# Patient Record
Sex: Female | Born: 1970
Health system: Southern US, Community
[De-identification: ages and names within clinical notes are randomized; demographics above are authoritative.]

## PROBLEM LIST (undated history)

## (undated) DIAGNOSIS — F32A Depression, unspecified: Secondary | ICD-10-CM

## (undated) DIAGNOSIS — K5900 Constipation, unspecified: Secondary | ICD-10-CM

## (undated) DIAGNOSIS — R600 Localized edema: Secondary | ICD-10-CM

## (undated) DIAGNOSIS — G4730 Sleep apnea, unspecified: Secondary | ICD-10-CM

## (undated) DIAGNOSIS — D179 Benign lipomatous neoplasm, unspecified: Secondary | ICD-10-CM

## (undated) DIAGNOSIS — K589 Irritable bowel syndrome without diarrhea: Secondary | ICD-10-CM

## (undated) DIAGNOSIS — D649 Anemia, unspecified: Secondary | ICD-10-CM

## (undated) DIAGNOSIS — N189 Chronic kidney disease, unspecified: Secondary | ICD-10-CM

## (undated) DIAGNOSIS — M2550 Pain in unspecified joint: Secondary | ICD-10-CM

## (undated) DIAGNOSIS — N289 Disorder of kidney and ureter, unspecified: Secondary | ICD-10-CM

## (undated) DIAGNOSIS — M1990 Unspecified osteoarthritis, unspecified site: Secondary | ICD-10-CM

## (undated) DIAGNOSIS — K219 Gastro-esophageal reflux disease without esophagitis: Secondary | ICD-10-CM

## (undated) DIAGNOSIS — M549 Dorsalgia, unspecified: Secondary | ICD-10-CM

## (undated) HISTORY — DX: Gastro-esophageal reflux disease without esophagitis: K21.9

## (undated) HISTORY — DX: Irritable bowel syndrome, unspecified: K58.9

## (undated) HISTORY — DX: Dorsalgia, unspecified: M54.9

## (undated) HISTORY — DX: Disorder of kidney and ureter, unspecified: N28.9

## (undated) HISTORY — DX: Chronic kidney disease, unspecified: N18.9

## (undated) HISTORY — PX: ENDOMETRIAL ABLATION: SHX621

## (undated) HISTORY — PX: ABLATION: SHX5711

## (undated) HISTORY — DX: Anemia, unspecified: D64.9

## (undated) HISTORY — DX: Pain in unspecified joint: M25.50

## (undated) HISTORY — DX: Benign lipomatous neoplasm, unspecified: D17.9

## (undated) HISTORY — DX: Constipation, unspecified: K59.00

## (undated) HISTORY — DX: Sleep apnea, unspecified: G47.30

## (undated) HISTORY — DX: Localized edema: R60.0

## (undated) HISTORY — DX: Depression, unspecified: F32.A

## (undated) HISTORY — DX: Unspecified osteoarthritis, unspecified site: M19.90

---

## 1999-02-04 ENCOUNTER — Other Ambulatory Visit: Admission: RE | Admit: 1999-02-04 | Discharge: 1999-02-04 | Payer: Self-pay | Admitting: Obstetrics and Gynecology

## 2000-02-19 ENCOUNTER — Other Ambulatory Visit: Admission: RE | Admit: 2000-02-19 | Discharge: 2000-02-19 | Payer: Self-pay | Admitting: Obstetrics and Gynecology

## 2001-03-23 ENCOUNTER — Other Ambulatory Visit: Admission: RE | Admit: 2001-03-23 | Discharge: 2001-03-23 | Payer: Self-pay | Admitting: Obstetrics and Gynecology

## 2002-05-01 ENCOUNTER — Other Ambulatory Visit: Admission: RE | Admit: 2002-05-01 | Discharge: 2002-05-01 | Payer: Self-pay | Admitting: Obstetrics and Gynecology

## 2003-07-09 ENCOUNTER — Other Ambulatory Visit: Admission: RE | Admit: 2003-07-09 | Discharge: 2003-07-09 | Payer: Self-pay | Admitting: Obstetrics and Gynecology

## 2004-08-21 ENCOUNTER — Other Ambulatory Visit: Admission: RE | Admit: 2004-08-21 | Discharge: 2004-08-21 | Payer: Self-pay | Admitting: Obstetrics and Gynecology

## 2004-09-07 ENCOUNTER — Encounter: Admission: RE | Admit: 2004-09-07 | Discharge: 2004-09-07 | Payer: Self-pay | Admitting: Surgery

## 2004-10-29 ENCOUNTER — Encounter (INDEPENDENT_AMBULATORY_CARE_PROVIDER_SITE_OTHER): Payer: Self-pay | Admitting: Specialist

## 2004-10-29 ENCOUNTER — Ambulatory Visit (HOSPITAL_BASED_OUTPATIENT_CLINIC_OR_DEPARTMENT_OTHER): Admission: RE | Admit: 2004-10-29 | Discharge: 2004-10-29 | Payer: Self-pay | Admitting: Surgery

## 2004-10-29 ENCOUNTER — Ambulatory Visit (HOSPITAL_COMMUNITY): Admission: RE | Admit: 2004-10-29 | Discharge: 2004-10-29 | Payer: Self-pay | Admitting: Surgery

## 2005-10-04 ENCOUNTER — Other Ambulatory Visit: Admission: RE | Admit: 2005-10-04 | Discharge: 2005-10-04 | Payer: Self-pay | Admitting: Obstetrics and Gynecology

## 2007-01-12 ENCOUNTER — Ambulatory Visit (HOSPITAL_COMMUNITY): Admission: RE | Admit: 2007-01-12 | Discharge: 2007-01-12 | Payer: Self-pay | Admitting: Obstetrics and Gynecology

## 2010-12-25 NOTE — Op Note (Signed)
Allison Terrell, BEUMER                 ACCOUNT NO.:  [id number]   MEDICAL RECORD NO.:  [id number]          PATIENT TYPE:  AMB   LOCATION:  DSC                          FACILITY:  [facility]   PHYSICIAN:  Currie Paris, M.D.DATE OF BIRTH:  11-Jul-1971   DATE OF PROCEDURE:  10/29/2004  DATE OF DISCHARGE:                                 OPERATIVE REPORT   PHYSICIAN'S OFFICE NUMBER:  [id number]   PREOPERATIVE DIAGNOSIS:  Abdominal wall mass, probable endometrioma.   POSTOPERATIVE DIAGNOSIS:  Abdominal wall mass, probable endometrioma.   OPERATION PERFORMED:  Removal of abdominal wall mass.   SURGEON:  Currie Paris, M.D.   ANESTHESIA:  General, LMA.   CLINICAL HISTORY:  This is a 40 year old lady who has had a questionable  abdominal wall hernia with an area in the old scar from her Pfannenstiel.  She thinks it gets more prominent around the time of her menstrual cycle.  On exam it felt like a small mass in the area and CT confirmed a small mass,  apparently attached or just anterior to the fascia.  After discussion with  the patient she elected to proceed to abdominal wall exploration with  excision of the mass.   DESCRIPTION OF OPERATION:  The patient was seen in the holding area and she  had no further questions. The mass itself was identified by the patient and  the area was marked by me and the patient.  She was brought to the operating  room and after satisfactory general anesthesia had been obtained the area  was prepped and draped.  The time out occurred.  I infiltrated a combination  of 1% Xylocaine and 0.5% plain Marcaine to help with postop analgesia.   I reopened the old Pfannenstiel scar, divided subcutaneous tissue and  identified where the mass was.  It appeared to be within the fascia.  Using  cautery the mass was excised and I took a small rim of tissue around it,  which therefore included a small amount of the anterior sheath over the  rectus.  The mass was  excised completely.  Although I appeared to have  intact muscle and some fascia underneath I went ahead and closed this with  some 0-Prolene to make sure we had good approximation of the anterior  sheath, and then put an onlay of Marlex mesh on top suturing it in with some  Prolene sutures in the usual fashion.  Everything was dry so the incision  was closed with 3-0 Vicryl followed by 4-0 Monocryl subcuticular and  Dermabond.   The patient tolerated the procedure well.   COMPLICATIONS:  There were no operative complications.   COUNTS:  All counts were correct.      CJS/MEDQ  D:  10/29/2004  T:  10/30/2004  Job:  [id number]   cc:   Windle Guard, M.D.  [address]  [place], [place] [address]  Fax: [phone]   D[facility] Salvia. Marcelle Overlie, M.D.  [address] [place]  [place] [address]  Fax: [phone]

## 2014-04-08 ENCOUNTER — Other Ambulatory Visit: Payer: Self-pay | Admitting: Obstetrics and Gynecology

## 2014-04-09 LAB — CYTOLOGY - PAP

## 2014-11-27 ENCOUNTER — Encounter: Payer: Self-pay | Admitting: Podiatry

## 2014-11-27 ENCOUNTER — Ambulatory Visit (INDEPENDENT_AMBULATORY_CARE_PROVIDER_SITE_OTHER): Payer: BLUE CROSS/BLUE SHIELD | Admitting: Podiatry

## 2014-11-27 ENCOUNTER — Ambulatory Visit (INDEPENDENT_AMBULATORY_CARE_PROVIDER_SITE_OTHER): Payer: BLUE CROSS/BLUE SHIELD

## 2014-11-27 VITALS — BP 115/74 | HR 86 | Resp 12

## 2014-11-27 DIAGNOSIS — M722 Plantar fascial fibromatosis: Secondary | ICD-10-CM

## 2014-11-27 DIAGNOSIS — R52 Pain, unspecified: Secondary | ICD-10-CM

## 2014-11-27 DIAGNOSIS — M205X1 Other deformities of toe(s) (acquired), right foot: Secondary | ICD-10-CM

## 2014-11-27 NOTE — Progress Notes (Signed)
Subjective:    Patient ID: Allison Terrell, female    DOB: 1971-03-25, 45 y.o.   MRN: [id number]  HPI  N-SORE L-LT BOTTOM OF THE HEEL D-5+ YEARS O-SLOWLY C-WORSE, ESPECIALLY FIRST THING IN THE MORNING. A-PRESSURE T-NONE Patient recalls 1 injection of the left heel some years ago, however, the heel pain has episodes of remission and exacerbation of the symptoms. She says that this is the secondary problem that she wants to have evaluated today  N-SORE, REDNESS L-RT FOOT D-5 MONTHS O-SLOWLY C-BETTER A-PRESSURE T-PRESSURE  Patient is concerned about painful right great toe joint that is uncomfortable standing and walking describing an aching type sensation. She describes using an Ace bandage to wrap the area. She would like to have this, evaluated.  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      Objective:   Physical Exam  Orientated 3  Vascular: DP pulses 2/4 bilaterally PT pulses 2/4 bilaterally Capillary reflex immediate bilaterally  Neurological: Sensation to 10 g monofilament wire intact 5/5 bilaterally Vibratory sensation intact bilaterally Ankle reflex equal and reactive bilaterally  Dermatological: Texture and turgor within normal limits  Musculoskeletal: Functional hallux limitus bilaterally Palpable tenderness in the right first MPJ with limited range of motion and tenderness upon range of motion. HAV deformity bilaterally There is palpable tenderness the medial plantar fascial insertional area without any palpable lesions  X-ray examination weightbearing right foot  Intact bony structure without fracture and/or dislocation Well-organized inferior calcaneal spur Posterior calcaneal  spur HAV deformity Narrowing of the right first metatarsophalangeal joint with squaring of the margins  Radiographic impression: No acute bony abnormality noted HAV deformity with associated mild osteoarthritis   X-ray examination weightbearing left foot  Intact bony structure without fracture and/or dislocation Posterior and inferior calcaneal spurs HAV deformity Mild narrowing of the first metatarsal phalangeal joint with squaring of the margins  Radiographic impression: No acute bony abnormality noted left foot HAV deformity with mild osteoarthritis first metatarsophalangeal joint          Assessment & Plan:   Assessment: Satisfactory neurovascular status Mild osteoarthritis first MPJ bilaterally right more symptomatic than left Hallux limitus bilaterally HAV deformities bilaterally Plantar fasciitis left  Plan: I had a detailed discussion with patient today reviewing the results of the examination x-rays. I made her aware that the right great toe joint pain was associated with mild osteoarthritis. I informed her that the problem generally tend to be more progressive depending on how much standing and/or walking . I also made aware that she has plantar fasciitis left and that the osteoarthritis in the plantar fasciitis were not directly related.  I discussed with patient treatment options for the osteoarthritis/hallux limitus right treatment options including relative reduction of physical activity standing, and/or walking. Also recommended using a exercise bike instead of jogging or running is much as possible. Also, I recommended a orthotic with a turf toe extension to reduce the flexion extension of right first MPJ. In addition I would have the orthotic fabricated to treatment of plantar fasciitis on the left foot. I offered patient custom foot orthotic and she verbally consented. A digital scan was obtained today for the fabrication of a custom foot orthotic  for the  indication of hallux limitus/rigidus right and plantar fasciitis left. A digital scan was obtained  Patient was discharged. At discharge the dischargedsecretary Threasa Beards) explained to patient about the  insurance benefits for orthotics. Threasa Beards said that she would  contact her insurance company directly to find out about these benefits. Apparently patient became quite agitated and in spite of repetitive attempts by [facility] as well as my assistant Vicente Males,  to explained to the patient that the orthotics were prescribed primarily for the hallux limitus, rigidus right also there would be benefits for plantar fasciitis left. In spite of attempts to explained to patient about the orthotics patient apparently became angry and left the office.

## 2014-11-27 NOTE — Patient Instructions (Signed)
Plantar Fasciitis Plantar fasciitis is a common condition that causes foot pain. It is soreness (inflammation) of the band of tough fibrous tissue on the bottom of the foot that runs from the heel bone (calcaneus) to the ball of the foot. The cause of this soreness may be from excessive standing, poor fitting shoes, running on hard surfaces, being overweight, having an abnormal walk, or overuse (this is common in runners) of the painful foot or feet. It is also common in aerobic exercise dancers and ballet dancers. SYMPTOMS  Most people with plantar fasciitis complain of:  Severe pain in the morning on the bottom of their foot especially when taking the first steps out of bed. This pain recedes after a few minutes of walking.  Severe pain is experienced also during walking following a long period of inactivity.  Pain is worse when walking barefoot or up stairs DIAGNOSIS   Your caregiver will diagnose this condition by examining and feeling your foot.  Special tests such as X-rays of your foot, are usually not needed. PREVENTION   Consult a sports medicine professional before beginning a new exercise program.  Walking programs offer a good workout. With walking there is a lower chance of overuse injuries common to runners. There is less impact and less jarring of the joints.  Begin all new exercise programs slowly. If problems or pain develop, decrease the amount of time or distance until you are at a comfortable level.  Wear good shoes and replace them regularly.  Stretch your foot and the heel cords at the back of the ankle (Achilles tendon) both before and after exercise.  Run or exercise on even surfaces that are not hard. For example, asphalt is better than pavement.  Do not run barefoot on hard surfaces.  If using a treadmill, vary the incline.  Do not continue to workout if you have foot or joint problems. Seek professional help if they do not improve. HOME CARE  INSTRUCTIONS   Avoid activities that cause you pain until you recover.  Use ice or cold packs on the problem or painful areas after working out.  Only take over-the-counter or prescription medicines for pain, discomfort, or fever as directed by your caregiver.  Soft shoe inserts or athletic shoes with air or gel sole cushions may be helpful.  If problems continue or become more severe, consult a sports medicine caregiver or your own health care provider. Cortisone is a potent anti-inflammatory medication that may be injected into the painful area. You can discuss this treatment with your caregiver. MAKE SURE YOU:   Understand these instructions.  Will watch your condition.  Will get help right away if you are not doing well or get worse. Document Released: 04/20/2001 Document Revised: 10/18/2011 Document Reviewed: 06/19/2008 [facility] Patient Information 2015 [place], [place]. This information is not intended to replace advice given to you by your health care provider. Make sure you discuss any questions you have with your health care provider.   Hallux Rigidus Hallux rigidus is a condition involving pain and a loss of motion of the first (big) toe. The pain gets worse with lifting up (extension) of the toe. This is usually due to arthritic bony bumps (spurring) of the joint at the base of the big toe.  SYMPTOMS   Pain, with lifting up of the toe.  Tenderness over the joint where the big toe meets the foot.  Redness, swelling, and warmth over the top of the base of the big toe (  sometimes).  Foot pain, stiffness, and limping. CAUSES  Hallux rigidus is caused by arthritis of the joint where the big toe meets the foot. The arthritis creates a bone spur that pinches the soft tissues when the toe is extended. RISK INCREASES WITH:  Tight shoes with a narrow toe box.  Family history of foot problems.  Gout and rheumatoid and psoriatic arthritis.  History of previous toe injury,  including "turf toe."  Long first toe, flat feet, and other big toe bony bumps.  Arthritis of the big toe. PREVENTION   Wear wide-toed shoes that fit well.  Tape the big toe to reduce motion and to prevent pinching of the tissues between the bone.  Maintain physical fitness:  Foot and ankle flexibility.  Muscle strength and endurance. PROGNOSIS  This condition can usually be managed with proper treatment. However, surgery is typically required to prevent the problem from recurring.  RELATED COMPLICATIONS  Injury to other areas of the foot or ankle, caused by abnormal walking in an attempt to avoid the pain felt when walking normally. TREATMENT Treatment first involves stopping the activities that aggravate your symptoms. Ice and medicine can be used to reduce the pain and inflammation. Modifications to shoes may help reduce pain, including wearing stiff-soled shoes, shoes with a wide toe box, inserting a padded donut to relieve pressure on top of the joint, or wearing an arch support. Corticosteroid injections may be given to reduce inflammation. If nonsurgical treatment is unsuccessful, surgery may be needed. Surgical options include removing the arthritic bony spur, cutting a bone in the foot to change the arc of motion (allowing the toe to extend more), or fusion of the joint (eliminating all motion in the joint at the base of the big toe).  MEDICATION   If pain medicine is needed, nonsteroidal anti-inflammatory medicines (aspirin and ibuprofen), or other minor pain relievers (acetaminophen), are often advised.  Do not take pain medicine for 7 days before surgery.  Prescription pain relievers are usually prescribed only after surgery. Use only as directed and only as much as you need.  Ointments for arthritis, applied to the skin, may give some relief.  Injections of corticosteroids may be given to reduce inflammation. HEAT AND COLD  Cold treatment (icing) relieves pain and  reduces inflammation. Cold treatment should be applied for 10 to 15 minutes every 2 to 3 hours, and immediately after activity that aggravates your symptoms. Use ice packs or an ice massage.  Heat treatment may be used before performing the stretching and strengthening activities prescribed by your caregiver, physical therapist, or athletic trainer. Use a heat pack or a warm water soak. SEEK MEDICAL CARE IF:   Symptoms get worse or do not improve in 2 weeks, despite treatment.  After surgery you develop fever, increasing pain, redness, swelling, drainage of fluids, bleeding, or increasing warmth.  New, unexplained symptoms develop. (Drugs used in treatment may produce side effects.) Document Released: 07/26/2005 Document Revised: 12/10/2013 Document Reviewed: 11/07/2008 [facility] Patient Information 2015 [place], [place]. This information is not intended to replace advice given to you by your health care provider. Make sure you discuss any questions you have with your health care provider.

## 2015-01-22 ENCOUNTER — Ambulatory Visit (INDEPENDENT_AMBULATORY_CARE_PROVIDER_SITE_OTHER): Payer: BLUE CROSS/BLUE SHIELD | Admitting: Podiatry

## 2015-01-22 DIAGNOSIS — M205X1 Other deformities of toe(s) (acquired), right foot: Secondary | ICD-10-CM

## 2015-01-22 NOTE — Patient Instructions (Signed)

## 2015-01-22 NOTE — Progress Notes (Signed)
   Subjective:    Patient ID: Allison Terrell, female    DOB: Dec 30, 1970, 44 y.o.   MRN: [id number]  HPI  PUO AND GIVEN INSTRUCTION.  Review of Systems     Objective:   Physical Exam  Custom orthotics with turf toe extension right contour satisfactorily      Assessment & Plan:   Assessment: Satisfactory fit of custom orthotics Hallux limitus right  Plan: Wearing instruction provided  Reappoint 6 weeks

## 2015-03-05 ENCOUNTER — Ambulatory Visit (INDEPENDENT_AMBULATORY_CARE_PROVIDER_SITE_OTHER): Payer: BLUE CROSS/BLUE SHIELD | Admitting: Podiatry

## 2015-03-05 ENCOUNTER — Encounter: Payer: Self-pay | Admitting: Podiatry

## 2015-03-05 VITALS — BP 118/78 | HR 62 | Resp 14

## 2015-03-05 DIAGNOSIS — M205X1 Other deformities of toe(s) (acquired), right foot: Secondary | ICD-10-CM

## 2015-03-05 NOTE — Patient Instructions (Signed)
Try wearing a larger shoe with the orthotic inside the right shoe Return 2 weeks

## 2015-03-06 NOTE — Progress Notes (Signed)
Patient ID: Allison Terrell, female   DOB: 04-Feb-1971, 44 y.o.   MRN: [id number]  Subjective: This patient presents for follow-up care for tolerance of foot orthotics with a turf toe extension on the right foot for treatment of hallux limitus/rigidus and plantar fasciitis left At this time patient states that she wears the orthotic somewhat intermittently has not changed her shoe size. Her complaint is that the turf toe extension ends at the plantar base of the distal phalanx, however, the distal adage embeds into the plantar pulp of the left hallux and she finds this uncomfortable.  Objective: Orthotics with curved toe extension contour satisfactorily  Assessment: Some intolerance of the turf toe extension right that may need to be extended to accommodate the patient's wish   Plan: I advised patient to purchase a larger pair shoes to see if this made the orthotic more comfortable. If this does not improve over time but will have the lab remake the orthotic with a turf toe extension standing approximately another 15 mm  Reappoint 4 weeks

## 2015-04-01 ENCOUNTER — Ambulatory Visit: Payer: BLUE CROSS/BLUE SHIELD | Admitting: Podiatry

## 2015-04-15 ENCOUNTER — Other Ambulatory Visit: Payer: Self-pay | Admitting: Obstetrics and Gynecology

## 2015-04-16 LAB — CYTOLOGY - PAP

## 2015-04-30 ENCOUNTER — Ambulatory Visit: Payer: BLUE CROSS/BLUE SHIELD | Admitting: Podiatry

## 2015-05-13 ENCOUNTER — Encounter: Payer: Self-pay | Admitting: Podiatry

## 2015-05-13 ENCOUNTER — Ambulatory Visit (INDEPENDENT_AMBULATORY_CARE_PROVIDER_SITE_OTHER): Payer: BLUE CROSS/BLUE SHIELD | Admitting: Podiatry

## 2015-05-13 VITALS — BP 151/81 | HR 82 | Resp 12

## 2015-05-13 DIAGNOSIS — M205X1 Other deformities of toe(s) (acquired), right foot: Secondary | ICD-10-CM

## 2015-05-13 DIAGNOSIS — M722 Plantar fascial fibromatosis: Secondary | ICD-10-CM

## 2015-05-13 NOTE — Progress Notes (Signed)
Patient ID: Allison Terrell, female   DOB: [birth year], 44 y.o.   MRN: [id number]  Subjective: Patient presents for follow-up care for tolerance of orthotics with extension to treat hallux limitus right and plantar fasciitis left. At this time she states that her right great toe joint has improved and she is having less pain. She also describes less pain in her left heel  Objective: First MPJ is not tender on range of motion or palpation Mild palpable tenderness medial plantar left heel without any palpable lesions  Assessment: Reducing symptoms of hallux limitus rigidus right Improving plantar fasciitis left Satisfactory fit of custom orthotics  Plan: Offered patient Kenalog injection left for fasciitis patient declines Patient will continue wearing custom foot orthotics on a daily basis  Reappoint at patient's request

## 2015-05-13 NOTE — Patient Instructions (Signed)
Continue wearing her custom foot orthotics an ongoing continuous basis when you stand or walk Return as needed

## 2016-03-11 ENCOUNTER — Encounter (HOSPITAL_COMMUNITY): Payer: Self-pay | Admitting: Emergency Medicine

## 2016-03-11 ENCOUNTER — Ambulatory Visit (HOSPITAL_COMMUNITY)
Admission: EM | Admit: 2016-03-11 | Discharge: 2016-03-11 | Disposition: A | Discharge status: Home / Self Care | Payer: BLUE CROSS/BLUE SHIELD | Attending: Emergency Medicine | Admitting: Emergency Medicine

## 2016-03-11 DIAGNOSIS — R1030 Lower abdominal pain, unspecified: Secondary | ICD-10-CM | POA: Diagnosis not present

## 2016-03-11 DIAGNOSIS — R3129 Other microscopic hematuria: Secondary | ICD-10-CM | POA: Diagnosis not present

## 2016-03-11 DIAGNOSIS — Z87442 Personal history of urinary calculi: Secondary | ICD-10-CM | POA: Diagnosis not present

## 2016-03-11 DIAGNOSIS — N189 Chronic kidney disease, unspecified: Secondary | ICD-10-CM | POA: Diagnosis not present

## 2016-03-11 DIAGNOSIS — R109 Unspecified abdominal pain: Secondary | ICD-10-CM | POA: Diagnosis present

## 2016-03-11 LAB — POCT URINALYSIS DIP (DEVICE)
BILIRUBIN URINE: NEGATIVE
Glucose, UA: NEGATIVE mg/dL
KETONES UR: NEGATIVE mg/dL
Leukocytes, UA: NEGATIVE
Nitrite: NEGATIVE
PH: 6 (ref 5.0–8.0)
Protein, ur: NEGATIVE mg/dL
SPECIFIC GRAVITY, URINE: 1.025 (ref 1.005–1.030)
Urobilinogen, UA: 0.2 mg/dL (ref 0.0–1.0)

## 2016-03-11 MED ORDER — TAMSULOSIN HCL 0.4 MG PO CAPS
0.4000 mg | ORAL_CAPSULE | Freq: Every day | ORAL | 0 refills | Status: DC
Start: 2016-03-11 — End: 2019-06-14

## 2016-03-11 MED ORDER — HYDROCODONE-ACETAMINOPHEN 5-325 MG PO TABS: 1.0000 | ORAL_TABLET | Freq: Four times a day (QID) | ORAL | 0 refills | Status: DC | PRN

## 2016-03-11 MED ORDER — IBUPROFEN 600 MG PO TABS: 600.0000 mg | ORAL_TABLET | Freq: Four times a day (QID) | ORAL | 0 refills | Status: DC | PRN

## 2016-03-11 NOTE — ED Provider Notes (Signed)
[place]    CSN: [id number] Arrival date & time: 03/11/16  1715  First Provider Contact:  First MD Initiated Contact with Patient 03/11/16 1836        History   Chief Complaint Chief Complaint  Patient presents with  . Abdominal Pain    HPI Allison Terrell is a 45 y.o. female.   She is a 45 year old woman here for evaluation of abdominal pain. She states this started last night. It is described as a cramping type pain located in the left side, left lower quadrant, and left lower back. It comes and goes. When the pain gets severe she will feel nauseous and clammy. It did improve with ibuprofen. Today, she continues to have discomfort, but it is more located across her lower abdomen. She denies any persistent nausea or vomiting. No diarrhea or constipation. She did have a normal for her bowel movement yesterday. Denies dysuria or urinary frequency. No change in the color of her urine. No fevers. She is postmenopausal. She does report a history of kidney stone and states this feels similar, but not quite the same.      Past Medical History:  Diagnosis Date  . Chronic kidney disease     There are no active problems to display for this patient.   Past Surgical History:  Procedure Laterality Date  . CESAREAN SECTION      OB History    No data available       Home Medications    Prior to Admission medications   Medication Sig Start Date End Date Taking? Authorizing Provider  HYDROcodone-acetaminophen (NORCO) 5-325 MG tablet Take 1 tablet by mouth every 6 (six) hours as needed for moderate pain. 03/11/16   Melony Overly, MD  ibuprofen (ADVIL,MOTRIN) 600 MG tablet Take 1 tablet (600 mg total) by mouth every 6 (six) hours as needed for moderate pain. 03/11/16   Melony Overly, MD  Norethindrone Acetate-Ethinyl Estrad-FE (LOESTRIN 24 FE) 1-20 MG-MCG(24) tablet  10/15/14   Historical Provider, MD  tamsulosin (FLOMAX) 0.4 MG CAPS capsule Take 1 capsule (0.4 mg total) by  mouth daily. 03/11/16   Melony Overly, MD    Family History No family history on file.  Social History Social History  Substance Use Topics  . Smoking status: Never Smoker  . Smokeless tobacco: Never Used  . Alcohol use Yes     Allergies   Review of patient's allergies indicates no known allergies.   Review of Systems Review of Systems  Constitutional: Negative for fever.  HENT: Negative.   Respiratory: Negative.   Cardiovascular: Negative.   Gastrointestinal: Positive for abdominal pain and nausea. Negative for constipation, diarrhea and vomiting.  Genitourinary: Negative for dysuria and vaginal bleeding.  Musculoskeletal: Positive for back pain.     Physical Exam Triage Vital Signs ED Triage Vitals  Enc Vitals Group     BP      Pulse      Resp      Temp      Temp src      SpO2      Weight      Height      Head Circumference      Peak Flow      Pain Score      Pain Loc      Pain Edu?      Excl. in [place]?    No data found.   Updated Vital Signs BP 138/76   Pulse  71   Temp 98.5 F (36.9 C) (Oral)   Resp 16   SpO2 99%   Visual Acuity Right Eye Distance:   Left Eye Distance:   Bilateral Distance:    Right Eye Near:   Left Eye Near:    Bilateral Near:     Physical Exam  Constitutional: She is oriented to person, place, and time. She appears well-developed and well-nourished. No distress.  Cardiovascular: Normal rate.   Pulmonary/Chest: Effort normal.  Abdominal: Soft. Bowel sounds are normal. She exhibits no distension and no mass. There is tenderness (suprapubic and right lower quadrant). There is no rebound and no guarding.  No CVA tenderness  Neurological: She is alert and oriented to person, place, and time.     UC Treatments / Results  Labs (all labs ordered are listed, but only abnormal results are displayed) Labs Reviewed  POCT URINALYSIS DIP (DEVICE) - Abnormal; Notable for the following:       Result Value   Hgb urine dipstick TRACE  (*)    All other components within normal limits  URINE CULTURE    EKG  EKG Interpretation None       Radiology No results found.  Procedures Procedures (including critical care time)  Medications Ordered in UC Medications - No data to display   Initial Impression / Assessment and Plan / UC Course  I have reviewed the triage vital signs and the nursing notes.  Pertinent labs & imaging results that were available during my care of the patient were reviewed by me and considered in my medical decision making (see chart for details).  Clinical Course    With some blood in her urine, will treat as a kidney stone with Flomax, ibuprofen, and hydrocodone. Urine sent for culture. Follow-up in the ER if not improving over the next 2-3 days.  Final Clinical Impressions(s) / UC Diagnoses   Final diagnoses:  Lower abdominal pain  Microhematuria    New Prescriptions New Prescriptions   HYDROCODONE-ACETAMINOPHEN (NORCO) 5-325 MG TABLET    Take 1 tablet by mouth every 6 (six) hours as needed for moderate pain.   IBUPROFEN (ADVIL,MOTRIN) 600 MG TABLET    Take 1 tablet (600 mg total) by mouth every 6 (six) hours as needed for moderate pain.   TAMSULOSIN (FLOMAX) 0.4 MG CAPS CAPSULE    Take 1 capsule (0.4 mg total) by mouth daily.     Melony Overly, MD 03/11/16 [phone]

## 2016-03-11 NOTE — Discharge Instructions (Signed)
This may be a kidney stone or cramps. Take ibuprofen 6 mg every 6 hours as needed for pain. Take Flomax daily until your pain resolves. This is to help a kidney stone pass. Use the hydrocodone every 4-6 hours as needed for severe pain. Do not drive while taking this medicine. If symptoms are not improving over the next 2-3 days, please go to the emergency room for additional evaluation.

## 2016-03-11 NOTE — ED Triage Notes (Signed)
PT reports abdominal pain over LLQ and lower back. PT reports a cramping sensation.  PT reports nausea. She denies vomiting and diarrhea.

## 2016-03-13 LAB — URINE CULTURE

## 2016-04-21 ENCOUNTER — Other Ambulatory Visit: Payer: Self-pay | Admitting: Obstetrics and Gynecology

## 2016-04-21 DIAGNOSIS — R109 Unspecified abdominal pain: Secondary | ICD-10-CM

## 2016-04-23 ENCOUNTER — Ambulatory Visit
Admission: RE | Admit: 2016-04-23 | Discharge: 2016-04-23 | Disposition: A | Discharge status: Home / Self Care | Payer: BLUE CROSS/BLUE SHIELD | Source: Ambulatory Visit | Attending: Obstetrics and Gynecology | Admitting: Obstetrics and Gynecology

## 2016-04-23 ENCOUNTER — Encounter: Payer: Self-pay | Admitting: Radiology

## 2016-04-23 DIAGNOSIS — R109 Unspecified abdominal pain: Secondary | ICD-10-CM

## 2016-04-23 MED ORDER — IOPAMIDOL (ISOVUE-300) INJECTION 61%
125.0000 mL | Freq: Once | INTRAVENOUS | Status: AC | PRN
  Administered 2016-04-23: 125 mL via INTRAVENOUS

## 2017-05-23 ENCOUNTER — Ambulatory Visit (INDEPENDENT_AMBULATORY_CARE_PROVIDER_SITE_OTHER): Payer: BLUE CROSS/BLUE SHIELD | Admitting: Sports Medicine

## 2017-05-23 VITALS — BP 126/81 | Ht 62.0 in | Wt 230.0 lb

## 2017-05-23 DIAGNOSIS — G8929 Other chronic pain: Secondary | ICD-10-CM | POA: Diagnosis not present

## 2017-05-23 DIAGNOSIS — M545 Low back pain: Secondary | ICD-10-CM | POA: Diagnosis not present

## 2017-05-23 NOTE — Progress Notes (Signed)
   Subjective:    Patient ID: Allison Terrell, female    DOB: January 18, 1971, 46 y.o.   MRN: [id number]  HPI chief complaint: Low back pain  Very pleasant 46 year old female comes in today at the request of her primary care physician for evaluation of low back pain. She describes an aching discomfort that is diffuse across her low-back. Symptoms began gradually about a year ago. Her pain is most noticeable first thing in the morning as well as with bending forward. She denies radiating pain into her legs. She denies numbness or tingling. She denies groin pain. She does take 400-600 mg of ibuprofen as needed and this does seem to help. She had x-rays done at her primary care physician's office and they are available for my review. No prior low back surgeries. No change in bowel or bladder.  Past medical history is reviewed Medications reviewed Allergies reviewed    Review of Systems As above    Objective:   Physical Exam  Well-developed, well-nourished. No acute distress. Awake alert and oriented 3. Vital signs reviewed. Patient sits currently in the exam room.  Lumbar spine: Diffuse tenderness to palpation along the lumbosacral area. No tenderness to palpation along the lumbar midline. There is a palpable trigger point just superior to the SI joint. No tenderness to palpation directly at the SI joint. Full lumbar range of motion. Pain with forward flexion. Painless extension.  Neurological exam: Strength is 5/5 in both lower extremities. Reflexes are brisk and equal at the Achilles and patellar tendons. No atrophy. Negative straight leg raise bilaterally. Sensation is intact to light touch grossly.  X-rays of her lumbar spine including AP and lumbar views are reviewed. Films are dated 05/19/2017. She has a small grade 1 anterior listhesis at L4-L5. L5-S1 disc space is not well visualized as the film is slightly underexposed. Nothing acute is seen.      Assessment & Plan:   Low back pain  likely secondary to mild degenerative disc disease  Patient would benefit the most from physical therapy. She also has some custom orthotics which were created for her by podiatry. She did not bring her orthotics with her today but she is concerned that she may be supinating when walking. Evaluation of her gait does in fact show her to supinate so I'll want her to bring her orthotics with her to her follow-up visit in 6 weeks. She may benefit from a lateral heel wedge. She will continue with when necessary ibuprofen as needed for pain and will call with questions or concerns prior to her follow-up visit.

## 2017-07-04 ENCOUNTER — Encounter: Payer: Self-pay | Admitting: Sports Medicine

## 2017-07-04 ENCOUNTER — Ambulatory Visit (INDEPENDENT_AMBULATORY_CARE_PROVIDER_SITE_OTHER): Payer: BLUE CROSS/BLUE SHIELD | Admitting: Sports Medicine

## 2017-07-04 VITALS — BP 139/89 | Ht 62.0 in | Wt 225.0 lb

## 2017-07-04 DIAGNOSIS — R269 Unspecified abnormalities of gait and mobility: Secondary | ICD-10-CM | POA: Diagnosis not present

## 2017-07-04 DIAGNOSIS — M545 Low back pain: Secondary | ICD-10-CM

## 2017-07-04 NOTE — Progress Notes (Signed)
   Subjective:    Patient ID: Allison Terrell, female    DOB: 1970/08/13, 45 y.o.   MRN: [id number]  HPI   Allison Terrell comes in today for follow-up on chronic low back pain. Pain is about the same. She has not yet started physical therapy. She did bring her custom orthotics with her today as requested. Recent x-rays of her lumbar spine show a small degenerative grade 1 anterior listhesis at L4-L5 and L5-S1. She continues to deny radiating pain into her legs. She continues to deny numbness or tingling.    Review of Systems As above    Objective:   Physical Exam  Well-developed, well-nourished. No acute distress. Awake alert and oriented 3. Vital signs reviewed. Patient sits comfortably in exam room  Good lumbar range of motion. No focal tenderness. No spasm. Neurological exam continues to demonstrate equal strength and reflexes bilaterally.  Reevaluation of her gait shows her to supinate severely. This is appreciated even with her custom orthotics in place.      Assessment & Plan:   Chronic low back pain Supination  New order for physical therapy was placed for her low back pain. We were able to fit her custom orthotic with a three-quarter length lateral heel wedge. This did improve her supination but it was not a full correction. We've asked her to purchase a new pair of shoes since the ones she currently has are quite worn. I would also like to fit her work shoes with a green sports insole with a lateral heel wedge in place (these shoes will not accommodate her custom orthotics). She will purchase a new pair of shoes and return to the office later this week for these. She will then follow-up with me 4 weeks after that.

## 2017-07-14 ENCOUNTER — Ambulatory Visit: Payer: BLUE CROSS/BLUE SHIELD | Attending: Sports Medicine | Admitting: Physical Therapy

## 2017-07-14 ENCOUNTER — Encounter: Payer: Self-pay | Admitting: Physical Therapy

## 2017-07-14 DIAGNOSIS — G8929 Other chronic pain: Secondary | ICD-10-CM

## 2017-07-14 DIAGNOSIS — M62830 Muscle spasm of back: Secondary | ICD-10-CM

## 2017-07-14 DIAGNOSIS — R2689 Other abnormalities of gait and mobility: Secondary | ICD-10-CM | POA: Diagnosis present

## 2017-07-14 DIAGNOSIS — M545 Low back pain, unspecified: Secondary | ICD-10-CM

## 2017-07-14 DIAGNOSIS — R293 Abnormal posture: Secondary | ICD-10-CM

## 2017-07-14 NOTE — Therapy (Signed)
[place], [place], [address] Phone: [phone]   Fax:  [phone]  Physical Therapy Evaluation  Patient Details  Name: Allison Terrell MRN: [id number] Date of Birth: [birth year] Referring Provider: Thurman Coyer, DO   Encounter Date: 07/14/2017  PT End of Session - 07/14/17 0959    Visit Number  1    Number of Visits  12    Date for PT Re-Evaluation  08/25/17    PT Start Time  0812 pt. arrived 11 minutes late    PT Stop Time  0845    PT Time Calculation (min)  33 min    Activity Tolerance  Patient tolerated treatment well    Behavior During Therapy  WFL for tasks assessed/performed       Past Medical History:  Diagnosis Date  . Chronic kidney disease     Past Surgical History:  Procedure Laterality Date  . CESAREAN SECTION      There were no vitals filed for this visit.   Subjective Assessment - 07/14/17 0819    Subjective  Pt. reports to OPPT with referral for chronic LBP that began about 2 years ago. Pt. reports being diagnosed with early stage arthritis which she believes may have begun onset of pain. Pt. also reports use of orthotic to prevent over supination of foot during gait which she also believes may contribute to her LBP. Pt. denies N/T and reports that pain generally stays localized in low back.     How long can you sit comfortably?  unlimitied     How long can you stand comfortably?  unlimited    How long can you walk comfortably?  unlinmited    Diagnostic tests  x-rays     Patient Stated Goals  slow down arthrits effects, strengthen core, pain relief     Currently in Pain?  Yes    Pain Score  1  at worst 6/10    Pain Location  Back    Pain Orientation  Right    Pain Descriptors / Indicators  Aching stiff    Pain Type  Chronic pain    Pain Onset  More than a month ago    Pain Frequency  Intermittent    Aggravating Factors   prolonged sitting, overuse of back     Pain  Relieving Factors  ibuprofen        [facility] PT Assessment - 07/14/17 0948      Assessment   Medical Diagnosis  Bilateral low back pain, unspecified chronicity, with sciatica presence unspecified    Referring Provider  Thurman Coyer, DO    Onset Date/Surgical Date  -- approximatley 2 years ago    Hand Dominance  Right    Next MD Visit  None currently     Prior Therapy  No      Precautions   Precautions  None    Required Braces or Orthoses  Other Brace/Splint    Other Brace/Splint  foot orthotic rt. foot      Restrictions   Weight Bearing Restrictions  No      Balance Screen   Has the patient fallen in the past 6 months  No    Has the patient had a decrease in activity level because of a fear of falling?   No    Is the patient reluctant to leave their home because of a fear of falling?   No      Home Environment  Living Environment  Private residence    Living Arrangements  Alone    Available Help at Discharge  Family    Type of [place] to enter    Entrance Stairs-Number of Steps  8    Entrance Stairs-Rails  Can reach both    [place]  One level    [place]  None      Prior Function   Level of Independence  Independent    Vocation  Full time employment    Vocation Requirements  prolonged sitting       Cognition   Overall Cognitive Status  Within Functional Limits for tasks assessed      Observation/Other Assessments   Focus on Therapeutic Outcomes (FOTO)   30% limited predicted 27% limited by 9th visit      Posture/Postural Control   Posture/Postural Control  Postural limitations    Postural Limitations  Rounded Shoulders;Forward head      AROM   Lumbar Flexion  68 pain at end range    Lumbar Extension  40 pain with motion    Lumbar - Right Side Bend  28 pain with motion    Lumbar - Left Side Bend  28 pain at end range      Palpation   Spinal mobility  no abnormal segment mobility noted L1-L5, no pain centrally or  unilaterally L1-L5    Palpation comment  Increased muscle tightness noted in rt. paraspinals compared to lt.      Special Tests    Special Tests  Sacrolliac Tests    Sacroiliac Tests   Gaenslen's Test      Sacral thrust    Findings  Negative    Side  Right      Gaenslen's test   Findings  Negative    Side   Right    Comments  also negative on Lt      Ambulation/Gait   Gait Pattern  Poor foot clearance - right;Wide base of support;Decreased step length - left;Decreased step length - right       Objective measurements completed on examination: See above findings.    PT Education - 07/14/17 0957    Education provided  Yes    Education Details  Pt. educated on test findings, initial HEP and POC    Person(s) Educated  Patient    Methods  Explanation;Handout    Comprehension  Verbalized understanding       PT Short Term Goals - 07/14/17 1017      PT SHORT TERM GOAL #1   Title  Pt. will be independent with initial HEP    Time  3    Period  Weeks    Status  New    Target Date  08/04/17      PT SHORT TERM GOAL #2   Title  Pt. will demonstrate improved posture in sitting/standing/walking and lifiting mechanics to prevent/reduce LPB and progress towards LTG.    Time  3    Period  Weeks    Status  New    Target Date  08/04/17       PT Long Term Goals - 07/14/17 1025      PT LONG TERM GOAL #1   Title  Pt. will mantain lumbar ROM WFL without pain during motion for increased ability to perform functional mobility.    Time  6    Period  Weeks    Status  New  Target Date  08/25/17      PT LONG TERM GOAL #2   Title  Pt. will be able to complete work shift with </= 1/10 pain to demonstrate improvement in condition and promote work related tasks.     Time  6    Period  Weeks    Status  New    Target Date  08/25/17      PT LONG TERM GOAL #3   Title  Pt. will increase FOTO score and decrease limitations to </= 27% predicted value.    Time  6    Period  Weeks     Status  New    Target Date  08/25/17      PT LONG TERM GOAL #4   Title  Pt. will be independent with all prescribed HEP as of final visit.    Time  6    Period  Weeks    Status  New    Target Date  08/25/17       Plan - 07/14/17 1000    Clinical Impression Statement  Pt. is 46 yo female presenting to OPPT with CC of chronic low back pain. ROM was [facility] however with pain during movements and end-range suggestive of contractile tissue involvement. Pt. had no noted hypomobility in lumbar segments but had increased tightness in R paraspinals. Special testing was unremarkable. Pt. will benefit from physical therapy for core strengthening and functional mobility training to decrease pain for improved ability to perform ADL's.    Clinical Presentation  Stable    Clinical Decision Making  Low    Rehab Potential  Good    PT Frequency  2x / week    PT Duration  6 weeks    PT Treatment/Interventions  Cryotherapy;Electrical Stimulation;Iontophoresis 4mg /ml Dexamethasone;Moist Heat;Traction;Gait training;Therapeutic activities;Therapeutic exercise;Manual techniques;Patient/family education;Dry needling;Taping    PT Next Visit Plan  lumbar stretching, core strengthening, hip strengthening, proper lifting mechanics, ergonomics, posture, manual therapy PRN     PT Home Exercise Plan  supine hamstring stretch, trunk rotations, supine pelvic tilt, child's pose     Consulted and Agree with Plan of Care  Patient       Patient will benefit from skilled therapeutic intervention in order to improve the following deficits and impairments:  Pain, Increased muscle spasms, Improper body mechanics  Visit Diagnosis: Chronic bilateral low back pain without sciatica  Muscle spasm of back  Abnormal posture  Other abnormalities of gait and mobility    Problem List There are no active problems to display for this patient.   Eulis Manly, SPT 07/14/17 1:21 PM   [place]  [facility] [address] [place], [place], [address] Phone: [phone]   Fax:  [phone]  Name: Allison Terrell MRN: [id number] Date of Birth: [birth year]

## 2017-07-19 ENCOUNTER — Encounter: Payer: Self-pay | Admitting: Physical Therapy

## 2017-07-19 ENCOUNTER — Ambulatory Visit: Payer: BLUE CROSS/BLUE SHIELD | Admitting: Physical Therapy

## 2017-07-19 DIAGNOSIS — G8929 Other chronic pain: Secondary | ICD-10-CM

## 2017-07-19 DIAGNOSIS — M545 Low back pain: Secondary | ICD-10-CM | POA: Diagnosis not present

## 2017-07-19 DIAGNOSIS — R293 Abnormal posture: Secondary | ICD-10-CM

## 2017-07-19 DIAGNOSIS — R2689 Other abnormalities of gait and mobility: Secondary | ICD-10-CM

## 2017-07-19 DIAGNOSIS — M62830 Muscle spasm of back: Secondary | ICD-10-CM

## 2017-07-19 NOTE — Therapy (Signed)
[facility] [place], [place], [address] Phone: [phone]   Fax:  [phone]  Physical Therapy Treatment  Patient Details  Name: Allison Terrell MRN: [id number] Date of Birth: 08/30/1970 Referring Provider: Thurman Coyer, DO   Encounter Date: 07/19/2017  PT End of Session - 07/19/17 1210    Visit Number  2    Number of Visits  12    Date for PT Re-Evaluation  08/25/17    PT Start Time  1150    PT Stop Time  1230    PT Time Calculation (min)  40 min       Past Medical History:  Diagnosis Date  . Chronic kidney disease     Past Surgical History:  Procedure Laterality Date  . CESAREAN SECTION      There were no vitals filed for this visit.                   [facility] Adult PT Treatment/Exercise - 07/19/17 0001      Self-Care   Self-Care  ADL's;Lifting    ADL's  Practiced golfers pick up, modifications to ADLS/ household chores    Lifting  Lift with hip hinge, lifted stool x 5       Lumbar Exercises: Stretches   Active Hamstring Stretch  3 reps;30 seconds    Lower Trunk Rotation  3 reps;30 seconds    Pelvic Tilt Limitations  20 reps 3 seconds     Prone Mid Back Stretch  1 rep;60 seconds      Lumbar Exercises: Aerobic   Stationary Bike  Nustep L4 UE/LE x 5 minutes       Lumbar Exercises: Seated   Other Seated Lumbar Exercises  seated pelvic rocking and circles on green physioball.       Lumbar Exercises: Supine   Clam  20 reps    Clam Limitations  green band, bilateral and unilateral                PT Short Term Goals - 07/14/17 1017      PT SHORT TERM GOAL #1   Title  Pt. will be independent with initial HEP    Time  3    Period  Weeks    Status  New    Target Date  08/04/17      PT SHORT TERM GOAL #2   Title  Pt. will demonstrate improved posture in sitting/standing/walking and lifiting mechanics to prevent/reduce LPB and progress towards LTG.    Time  3    Period  Weeks    Status  New    Target Date  08/04/17        PT Long Term Goals - 07/14/17 1025      PT LONG TERM GOAL #1   Title  Pt. will mantain lumbar ROM WFL without pain during motion for increased ability to perform functional mobility.    Time  6    Period  Weeks    Status  New    Target Date  08/25/17      PT LONG TERM GOAL #2   Title  Pt. will be able to complete work shift with </= 1/10 pain to demonstrate improvement in condition and promote work related tasks.     Time  6    Period  Weeks    Status  New    Target Date  08/25/17      PT LONG TERM GOAL #  3   Title  Pt. will increase FOTO score and decrease limitations to </= 27% predicted value.    Time  6    Period  Weeks    Status  New    Target Date  08/25/17      PT LONG TERM GOAL #4   Title  Pt. will be independent with all prescribed HEP as of final visit.    Time  6    Period  Weeks    Status  New    Target Date  08/25/17            Plan - 07/19/17 1254    Clinical Impression Statement  Pt reports the exercises are helping. We reviewed and added supine clams to HEP. Spent time dicussing and practicing ADL/household chore modifications. She retruned demonstration of proper hip hinge with lifting. She has an exercise ball to sit on at work so I instructed her in seated pelvic rocking and circles. No increased pain at end of treatment.     PT Next Visit Plan  lumbar stretching, core strengthening, hip strengthening, proper lifting mechanics, ergonomics, posture, manual therapy PRN     PT Home Exercise Plan  supine hamstring stretch, trunk rotations, supine pelvic tilt, child's pose , supine clams with green band     Consulted and Agree with Plan of Care  Patient       Patient will benefit from skilled therapeutic intervention in order to improve the following deficits and impairments:  Pain, Increased muscle spasms, Improper body mechanics  Visit Diagnosis: Chronic bilateral low back pain without  sciatica  Muscle spasm of back  Abnormal posture  Other abnormalities of gait and mobility     Problem List There are no active problems to display for this patient.   Dorene Ar, [place] 07/19/2017, 1:09 PM  [place], [place], [address] Phone: [phone]   Fax:  [phone]  Name: Allison Terrell MRN: [id number] Date of Birth: [birth year]

## 2017-07-27 ENCOUNTER — Encounter: Payer: Self-pay | Admitting: Physical Therapy

## 2017-07-27 ENCOUNTER — Ambulatory Visit: Payer: BLUE CROSS/BLUE SHIELD | Admitting: Physical Therapy

## 2017-07-27 DIAGNOSIS — M545 Low back pain: Secondary | ICD-10-CM | POA: Diagnosis not present

## 2017-07-27 DIAGNOSIS — R293 Abnormal posture: Secondary | ICD-10-CM

## 2017-07-27 DIAGNOSIS — M62830 Muscle spasm of back: Secondary | ICD-10-CM

## 2017-07-27 DIAGNOSIS — R2689 Other abnormalities of gait and mobility: Secondary | ICD-10-CM

## 2017-07-27 DIAGNOSIS — G8929 Other chronic pain: Secondary | ICD-10-CM

## 2017-07-27 NOTE — Therapy (Signed)
[place], [place], [address] Phone: [phone]   Fax:  [phone]  Physical Therapy Treatment  Patient Details  Name: Allison Terrell MRN: [id number] Date of Birth: 07-08-1971 Referring Provider: Thurman Coyer, DO   Encounter Date: 07/27/2017  PT End of Session - 07/27/17 1259    Visit Number  3    Number of Visits  12    Date for PT Re-Evaluation  08/25/17    PT Start Time  1254    PT Stop Time  1340    PT Time Calculation (min)  46 min       Past Medical History:  Diagnosis Date  . Chronic kidney disease     Past Surgical History:  Procedure Laterality Date  . CESAREAN SECTION      There were no vitals filed for this visit.  Subjective Assessment - 07/27/17 1255    Subjective  Pain can be a 6/10.    Currently in Pain?  Yes    Pain Score  1     Pain Location  Back    Pain Orientation  Right    Pain Descriptors / Indicators  Aching stiff    Aggravating Factors   sitting, sit-stands, , stiff in morning, end of day achiness    Pain Relieving Factors  ibuprofen                       [facility] Adult PT Treatment/Exercise - 07/27/17 0001      Lumbar Exercises: Stretches   Prone Mid Back Stretch Limitations  seated physioball stretch forward and latera x 3 each       Lumbar Exercises: Aerobic   Stationary Bike  Nustep L4 UE/LE x 6 minutes       Lumbar Exercises: Standing   Wall Slides  10 reps    Wall Slides Limitations  with abdominal brace, c/o knee pain      Lumbar Exercises: Seated   Other Seated Lumbar Exercises  seated pelvic rocking and circles on green physioball.       Lumbar Exercises: Supine   Clam  20 reps    Clam Limitations  green band, bilateral and unilateral     Bridge  10 reps green band external rotation isometric    Other Supine Lumbar Exercises  scissors to table top, hold 5 sec, retrun one leg at a time x 5       Knee/Hip Exercises: Supine   Bridges with Clamshell  5 reps;10 reps    Single Leg Bridge  -- alternating knee extension 10 reps x 3              PT Education - 07/27/17 1329    Education provided  Yes    Education Details  HEP    Person(s) Educated  Patient    Methods  Explanation;Handout    Comprehension  Verbalized understanding       PT Short Term Goals - 07/14/17 1017      PT SHORT TERM GOAL #1   Title  Pt. will be independent with initial HEP    Time  3    Period  Weeks    Status  New    Target Date  08/04/17      PT SHORT TERM GOAL #2   Title  Pt. will demonstrate improved posture in sitting/standing/walking and lifiting mechanics to prevent/reduce LPB and progress towards LTG.    Time  3    Period  Weeks    Status  New    Target Date  08/04/17        PT Long Term Goals - 07/14/17 1025      PT LONG TERM GOAL #1   Title  Pt. will mantain lumbar ROM WFL without pain during motion for increased ability to perform functional mobility.    Time  6    Period  Weeks    Status  New    Target Date  08/25/17      PT LONG TERM GOAL #2   Title  Pt. will be able to complete work shift with </= 1/10 pain to demonstrate improvement in condition and promote work related tasks.     Time  6    Period  Weeks    Status  New    Target Date  08/25/17      PT LONG TERM GOAL #3   Title  Pt. will increase FOTO score and decrease limitations to </= 27% predicted value.    Time  6    Period  Weeks    Status  New    Target Date  08/25/17      PT LONG TERM GOAL #4   Title  Pt. will be independent with all prescribed HEP as of final visit.    Time  6    Period  Weeks    Status  New    Target Date  08/25/17            Plan - 07/27/17 1330    Clinical Impression Statement  Pt has been modifying body mechanics with ADLS. Pt seems about the same with 6/10 when sitting prolonged and with transitions, especially if repetitive. We progressed core strengthening and updated HEP. No pain with core  exercises. Also instructed pt in stretches she could do seated and rolling ball forward and to the side.     PT Next Visit Plan  FOTO, possible discharge due to deductible starting over; lumbar stretching, core strengthening, hip strengthening, proper lifting mechanics, ergonomics, posture, manual therapy PRN     PT Home Exercise Plan  supine hamstring stretch, trunk rotations, supine pelvic tilt, child's pose , supine clams with green band , bridge with kicks, table top hold     Consulted and Agree with Plan of Care  Patient       Patient will benefit from skilled therapeutic intervention in order to improve the following deficits and impairments:  Pain, Increased muscle spasms, Improper body mechanics  Visit Diagnosis: Chronic bilateral low back pain without sciatica  Muscle spasm of back  Abnormal posture  Other abnormalities of gait and mobility     Problem List There are no active problems to display for this patient.   Dorene Ar, [place] 07/27/2017, 1:44 PM  [place], [place], [address] Phone: [phone]   Fax:  [phone]  Name: Allison Terrell MRN: [id number] Date of Birth: [birth year]

## 2017-07-29 ENCOUNTER — Ambulatory Visit: Payer: BLUE CROSS/BLUE SHIELD | Admitting: Physical Therapy

## 2017-07-29 ENCOUNTER — Encounter: Payer: Self-pay | Admitting: Physical Therapy

## 2017-07-29 DIAGNOSIS — G8929 Other chronic pain: Secondary | ICD-10-CM

## 2017-07-29 DIAGNOSIS — R293 Abnormal posture: Secondary | ICD-10-CM

## 2017-07-29 DIAGNOSIS — M545 Low back pain: Secondary | ICD-10-CM | POA: Diagnosis not present

## 2017-07-29 DIAGNOSIS — M62830 Muscle spasm of back: Secondary | ICD-10-CM

## 2017-07-29 DIAGNOSIS — R2689 Other abnormalities of gait and mobility: Secondary | ICD-10-CM

## 2017-07-29 NOTE — Therapy (Addendum)
[place], [place], [address] Phone: [phone]   Fax:  [phone]  Physical Therapy Treatment / Discharge Summary  Patient Details  Name: Allison Terrell MRN: [id number] Date of Birth: 05-18-1971 Referring Provider: Thurman Coyer, DO   Encounter Date: 07/29/2017  PT End of Session - 07/29/17 0900    Visit Number  4    Number of Visits  12    Date for PT Re-Evaluation  08/25/17    PT Start Time  8786 10 minutes late     PT Stop Time  0928    PT Time Calculation (min)  33 min       Past Medical History:  Diagnosis Date  . Chronic kidney disease     Past Surgical History:  Procedure Laterality Date  . CESAREAN SECTION      There were no vitals filed for this visit.  Subjective Assessment - 07/29/17 0856    Subjective  A little sore     Currently in Pain?  Yes    Pain Score  2     Pain Location  Back    Pain Orientation  Mid    Pain Descriptors / Indicators  Sore         [facility] PT Assessment - 07/29/17 0001      AROM   Lumbar Flexion  70    Lumbar Extension  40    Lumbar - Right Side Bend  28    Lumbar - Left Side Bend  28                  [facility] Adult PT Treatment/Exercise - 07/29/17 0001      Lumbar Exercises: Stretches   Active Hamstring Stretch  3 reps;30 seconds    Single Knee to Chest Stretch  3 reps;30 seconds    Lower Trunk Rotation  3 reps;30 seconds    Pelvic Tilt Limitations  20 reps 3 seconds       Lumbar Exercises: Supine   Clam  20 reps    Clam Limitations  green band, bilateral and unilateral     Other Supine Lumbar Exercises  scissors to table top, hold 5 sec, retrun one leg at a time x 5       Knee/Hip Exercises: Supine   Bridges  10 reps    Bridges with Clamshell  5 reps;10 reps    Single Leg Bridge  -- alternating knee extension 10 reps x 3              PT Education - 07/29/17 1022    Education provided  Yes    Education Details   reinforced posture and body mechanics, frequent standing breaks at work, continue HEP consistently, core activation throughout day    Person(s) Educated  Patient    Methods  Explanation    Comprehension  Verbalized understanding       PT Short Term Goals - 07/29/17 0902      PT SHORT TERM GOAL #1   Title  Pt. will be independent with initial HEP    Time  3    Period  Weeks    Status  Achieved      PT SHORT TERM GOAL #2   Title  Pt. will demonstrate improved posture in sitting/standing/walking and lifiting mechanics to prevent/reduce LPB and progress towards LTG.    Baseline  is adjusting ADLS, given instruction    Time  3  Period  Weeks    Status  Achieved        PT Long Term Goals - 07/29/17 8341      PT LONG TERM GOAL #1   Title  Pt. will mantain lumbar ROM WFL without pain during motion for increased ability to perform functional mobility.    Baseline  no pain with flexion today. mild pain with SB and extension    Time  6    Period  Weeks    Status  Partially Met      PT LONG TERM GOAL #2   Title  Pt. will be able to complete work shift with </= 1/10 pain to demonstrate improvement in condition and promote work related tasks.     Baseline  2/10 with transitions at work    Time  6    Period  Weeks    Status  Not Met      PT LONG TERM GOAL #3   Title  Pt. will increase FOTO score and decrease limitations to </= 27% predicted value.    Baseline  30% no change     Time  6    Period  Weeks    Status  Not Met      PT LONG TERM GOAL #4   Title  Pt. will be independent with all prescribed HEP as of final visit.    Time  6    Period  Weeks    Status  Achieved            Plan - 07/29/17 0909    Clinical Impression Statement  Pt reports improved pain with transitions after prolonged sitting at work however pain still reaches 2/10. 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 Contined encouragement for posture and body mechanics. with ADLS. She  reports compliance with Body Mechanics with ADLS and work. STG# 1, 2 met. Her FOTO score did not change. Her Lumbar AROM has improved however still pain at end range with SB and extension. LTG# 1 partially met. LTG #4 met. Pt would like to discharge due to end of year and insurance changes starting in the new year. She will continue with HEP.     PT Next Visit Plan  Discharge today    PT Home Exercise Plan  supine hamstring stretch, trunk rotations, supine pelvic tilt, child's pose , supine clams with green band , bridge with kicks, table top hold     Consulted and Agree with Plan of Care  Patient       Patient will benefit from skilled therapeutic intervention in order to improve the following deficits and impairments:  Pain, Increased muscle spasms, Improper body mechanics  Visit Diagnosis: Chronic bilateral low back pain without sciatica  Muscle spasm of back  Abnormal posture  Other abnormalities of gait and mobility     Problem List There are no active problems to display for this patient.   Dorene Ar, [place] 07/29/2017, 10:23 AM  [place], [place], [address] Phone: [phone]   Fax:  [phone]  Name: Allison Terrell MRN: [id number] Date of Birth: 1970-08-24      PHYSICAL THERAPY DISCHARGE SUMMARY  Visits from Start of Care: 4  Current functional level related to goals / functional outcomes: See goals   Remaining deficits: See above assessment   Education / Equipment: HEP, theraband, posture  Plan: Patient agrees to discharge.  Patient goals were partially met. Patient is  being discharged due to meeting the stated rehab goals.  ?????     Kristoffer Leamon PT, DPT, LAT, ATC  07/29/17  12:41 PM

## 2017-08-10 ENCOUNTER — Encounter: Payer: BLUE CROSS/BLUE SHIELD | Admitting: Physical Therapy

## 2017-08-12 ENCOUNTER — Encounter: Payer: BLUE CROSS/BLUE SHIELD | Admitting: Physical Therapy

## 2017-08-15 ENCOUNTER — Encounter: Payer: BLUE CROSS/BLUE SHIELD | Admitting: Physical Therapy

## 2017-08-17 ENCOUNTER — Encounter: Payer: BLUE CROSS/BLUE SHIELD | Admitting: Physical Therapy

## 2018-03-10 DIAGNOSIS — S1096XA Insect bite of unspecified part of neck, initial encounter: Secondary | ICD-10-CM | POA: Diagnosis not present

## 2018-04-26 DIAGNOSIS — Z800 Family history of malignant neoplasm of digestive organs: Secondary | ICD-10-CM | POA: Diagnosis not present

## 2018-04-26 DIAGNOSIS — Z6841 Body Mass Index (BMI) 40.0 and over, adult: Secondary | ICD-10-CM | POA: Diagnosis not present

## 2018-04-26 DIAGNOSIS — Z801 Family history of malignant neoplasm of trachea, bronchus and lung: Secondary | ICD-10-CM | POA: Diagnosis not present

## 2018-04-26 DIAGNOSIS — Z803 Family history of malignant neoplasm of breast: Secondary | ICD-10-CM | POA: Diagnosis not present

## 2018-04-26 DIAGNOSIS — Z1231 Encounter for screening mammogram for malignant neoplasm of breast: Secondary | ICD-10-CM | POA: Diagnosis not present

## 2018-04-26 DIAGNOSIS — Z01419 Encounter for gynecological examination (general) (routine) without abnormal findings: Secondary | ICD-10-CM | POA: Diagnosis not present

## 2018-05-23 DIAGNOSIS — Z0000 Encounter for general adult medical examination without abnormal findings: Secondary | ICD-10-CM | POA: Diagnosis not present

## 2018-05-23 DIAGNOSIS — R5383 Other fatigue: Secondary | ICD-10-CM | POA: Diagnosis not present

## 2018-05-26 DIAGNOSIS — Z0000 Encounter for general adult medical examination without abnormal findings: Secondary | ICD-10-CM | POA: Diagnosis not present

## 2018-06-12 DIAGNOSIS — Z809 Family history of malignant neoplasm, unspecified: Secondary | ICD-10-CM | POA: Diagnosis not present

## 2018-08-14 DIAGNOSIS — R51 Headache: Secondary | ICD-10-CM | POA: Diagnosis not present

## 2018-08-14 DIAGNOSIS — H5213 Myopia, bilateral: Secondary | ICD-10-CM | POA: Diagnosis not present

## 2018-08-14 DIAGNOSIS — H04123 Dry eye syndrome of bilateral lacrimal glands: Secondary | ICD-10-CM | POA: Diagnosis not present

## 2018-08-14 DIAGNOSIS — H52223 Regular astigmatism, bilateral: Secondary | ICD-10-CM | POA: Diagnosis not present

## 2019-05-30 DIAGNOSIS — Z0000 Encounter for general adult medical examination without abnormal findings: Secondary | ICD-10-CM | POA: Diagnosis not present

## 2019-05-30 DIAGNOSIS — E559 Vitamin D deficiency, unspecified: Secondary | ICD-10-CM | POA: Diagnosis not present

## 2019-05-30 DIAGNOSIS — R5383 Other fatigue: Secondary | ICD-10-CM | POA: Diagnosis not present

## 2019-06-04 DIAGNOSIS — Z0000 Encounter for general adult medical examination without abnormal findings: Secondary | ICD-10-CM | POA: Diagnosis not present

## 2019-06-04 DIAGNOSIS — E559 Vitamin D deficiency, unspecified: Secondary | ICD-10-CM | POA: Diagnosis not present

## 2019-06-04 DIAGNOSIS — G43109 Migraine with aura, not intractable, without status migrainosus: Secondary | ICD-10-CM | POA: Diagnosis not present

## 2019-06-04 DIAGNOSIS — B353 Tinea pedis: Secondary | ICD-10-CM | POA: Diagnosis not present

## 2019-06-04 DIAGNOSIS — R5383 Other fatigue: Secondary | ICD-10-CM | POA: Diagnosis not present

## 2019-06-14 ENCOUNTER — Encounter: Payer: Self-pay | Admitting: Neurology

## 2019-06-18 ENCOUNTER — Ambulatory Visit (INDEPENDENT_AMBULATORY_CARE_PROVIDER_SITE_OTHER): Payer: BLUE CROSS/BLUE SHIELD | Admitting: Neurology

## 2019-06-18 ENCOUNTER — Encounter: Payer: Self-pay | Admitting: Neurology

## 2019-06-18 ENCOUNTER — Other Ambulatory Visit: Payer: Self-pay

## 2019-06-18 VITALS — BP 121/82 | HR 81 | Ht 62.0 in | Wt 228.0 lb

## 2019-06-18 DIAGNOSIS — R0681 Apnea, not elsewhere classified: Secondary | ICD-10-CM

## 2019-06-18 DIAGNOSIS — Z1231 Encounter for screening mammogram for malignant neoplasm of breast: Secondary | ICD-10-CM | POA: Diagnosis not present

## 2019-06-18 DIAGNOSIS — R351 Nocturia: Secondary | ICD-10-CM | POA: Diagnosis not present

## 2019-06-18 DIAGNOSIS — L680 Hirsutism: Secondary | ICD-10-CM | POA: Diagnosis not present

## 2019-06-18 DIAGNOSIS — N951 Menopausal and female climacteric states: Secondary | ICD-10-CM | POA: Diagnosis not present

## 2019-06-18 DIAGNOSIS — Z6841 Body Mass Index (BMI) 40.0 and over, adult: Secondary | ICD-10-CM

## 2019-06-18 DIAGNOSIS — G4719 Other hypersomnia: Secondary | ICD-10-CM

## 2019-06-18 DIAGNOSIS — R0683 Snoring: Secondary | ICD-10-CM

## 2019-06-18 DIAGNOSIS — G43909 Migraine, unspecified, not intractable, without status migrainosus: Secondary | ICD-10-CM | POA: Insufficient documentation

## 2019-06-18 DIAGNOSIS — F32A Depression, unspecified: Secondary | ICD-10-CM | POA: Insufficient documentation

## 2019-06-18 DIAGNOSIS — R87619 Unspecified abnormal cytological findings in specimens from cervix uteri: Secondary | ICD-10-CM | POA: Insufficient documentation

## 2019-06-18 DIAGNOSIS — M1990 Unspecified osteoarthritis, unspecified site: Secondary | ICD-10-CM | POA: Insufficient documentation

## 2019-06-18 DIAGNOSIS — N200 Calculus of kidney: Secondary | ICD-10-CM | POA: Insufficient documentation

## 2019-06-18 DIAGNOSIS — F329 Major depressive disorder, single episode, unspecified: Secondary | ICD-10-CM | POA: Insufficient documentation

## 2019-06-18 DIAGNOSIS — Z01419 Encounter for gynecological examination (general) (routine) without abnormal findings: Secondary | ICD-10-CM | POA: Diagnosis not present

## 2019-06-18 DIAGNOSIS — R5383 Other fatigue: Secondary | ICD-10-CM | POA: Diagnosis not present

## 2019-06-18 NOTE — Patient Instructions (Signed)

## 2019-06-18 NOTE — Progress Notes (Signed)
Subjective:    Patient ID: Allison Terrell is a 48 y.o. female.  HPI     Star Age, MD, PhD [facility] [address] [place], [place] [address] Dear Dr. Shelia Media,   I saw your patient, Allison Terrell, upon your kind request in my sleep clinic today for initial consultation of her sleep disorder, in particular, concern for underlying obstructive sleep apnea.  The patient is unaccompanied today.  As you know, [name] is a 48 year old right-handed woman with an underlying medical history of chronic low back pain, chronic kidney disease, depression and obesity, who reports snoring and excessive daytime somnolence, as well as witnessed apneas per her boyfriend's report.  I reviewed your office note from 06/04/2019, which you kindly included.  Her Epworth sleepiness score is 14 out of 24, fatigue severity score is 45 out of 63.  She has woken up occasionally with a sense of choking.  It does not happen very often.  She is a restless sleeper.  She has trouble staying asleep.  She has nocturia about 2-3 times per average night but denies recurrent morning headaches.  Her maternal grandfather had sleep apnea in her mother snores but has not been officially tested.  Her bedtime is around 1130 and her rise time around 830.  She lives with her boyfriend and her mother-in-law.  She has 1 son, age 17.  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  She has lost a little bit of weight recently.  She drinks caffeine in the form of coffee and tea, 1 large cup of coffee and 1-2 servings of tea per day, she quit smoking in 2010.  She drinks alcohol occasionally, once or twice a week.  Her Past Medical History Is Significant For: Past Medical History:  Diagnosis Date  . Chronic kidney disease     Her Past Surgical History Is Significant For: Past Surgical History:  Procedure Laterality Date  . CESAREAN SECTION    . ENDOMETRIAL ABLATION       Her Family History Is Significant For: Family History  Problem Relation Age of Onset  . Lung cancer Mother   . Heart disease Mother   . Lung cancer Father   . Heart disease Father   . Asthma Sister     Her Social History Is Significant For: Social History   Socioeconomic History  . Marital status: Single    Spouse name: Not on file  . Number of children: Not on file  . Years of education: Not on file  . Highest education level: Not on file  Occupational History  . Not on file  Social Needs  . Financial resource strain: Not on file  . Food insecurity    Worry: Not on file    Inability: Not on file  . Transportation needs    Medical: Not on file    Non-medical: Not on file  Tobacco Use  . Smoking status: Former Research scientist (life sciences)  . Smokeless tobacco: Never Used  Substance and Sexual Activity  . Alcohol use: Yes    Comment: rare  . Drug use: No  . Sexual activity: Not on file  Lifestyle  . Physical activity    Days per week: Not on file    Minutes per session: Not on file  . Stress: Not on file  Relationships  . Social Herbalist on phone: Not on file    Gets together: Not  on file    Attends religious service: Not on file    Active member of club or organization: Not on file    Attends meetings of clubs or organizations: Not on file    Relationship status: Not on file  Other Topics Concern  . Not on file  Social History Narrative  . Not on file    Her Allergies Are:  No Known Allergies:   Her Current Medications Are:  Outpatient Encounter Medications as of 06/18/2019  Medication Sig  . buPROPion (WELLBUTRIN XL) 150 MG 24 hr tablet   . chlorhexidine (PERIDEX) 0.12 % solution RINSE 2 TIMES A DAY ONCE IN THE MORNING AND BEFORE BEDTIME FOR 30 SECONDS  . [DISCONTINUED] aspirin 325 MG tablet Take 325 mg by mouth daily.  . [DISCONTINUED] ibuprofen (ADVIL,MOTRIN) 600 MG tablet Take 1 tablet (600 mg total) by mouth every 6 (six) hours as needed for moderate  pain.  . [DISCONTINUED] Multiple Vitamin (MULTIVITAMIN) tablet Take 1 tablet by mouth daily.  . [DISCONTINUED] Probiotic Product (PROBIOTIC DAILY PO) Take 1 capsule by mouth daily.   No facility-administered encounter medications on file as of 06/18/2019.   :  Review of Systems:  Out of a complete 14 point review of systems, all are reviewed and negative with the exception of these symptoms as listed below: Review of Systems  Neurological:       Pt presents today to discuss her sleep. Pt has never had a sleep study but does endorse snoring.  Epworth Sleepiness Scale 0= would never doze 1= slight chance of dozing 2= moderate chance of dozing 3= high chance of dozing  Sitting and reading: 3 Watching TV: 2 Sitting inactive in a public place (ex. Theater or meeting): 2 As a passenger in a car for an hour without a break: 2 Lying down to rest in the afternoon:3 Sitting and talking to someone: 0 Sitting quietly after lunch (no alcohol): 2 In a car, while stopped in traffic: 0 Total: 14     Objective:  Neurological Exam  Physical Exam Physical Examination:   Vitals:   06/18/19 1626  BP: 121/82  Pulse: 81    General Examination: The patient is a very pleasant 48 y.o. female in no acute distress. She appears well-developed and well-nourished and well groomed.   HEENT: Normocephalic, atraumatic, pupils are equal, round and reactive to light, Extraocular tracking is well preserved, face is symmetric with normal facial animation, hearing is grossly intact.  She has no dysarthria, hypophonia or voice tremor, neck is supple with full range of motion, no carotid bruits.  Airway examination Reveals moderate mouth dryness, moderate airway crowding secondary to small airway opening, tonsils are on the smaller side, tongue protrudes centrally in palate elevates symmetrically, neck circumference is 15-1/2 inches, she has marginal dental hygiene.  She reports chronic problems with her teeth  and loose teeth and receding gums. No significant overbite.   Chest: Clear to auscultation without wheezing, rhonchi or crackles noted.  Heart: S1+S2+0, regular and normal without murmurs, rubs or gallops noted.   Abdomen: Soft, non-tender and non-distended with normal bowel sounds appreciated on auscultation.  Extremities: There is no pitting edema in the distal lower extremities bilaterally. Nonpitting puffiness noted in both ankles.  Skin: Warm and dry without trophic changes noted.   Musculoskeletal: exam reveals no obvious joint deformities, tenderness or joint swelling or erythema.   Neurologically:  Mental status: The patient is awake, alert and oriented in all 4 spheres. Her immediate and  remote memory, attention, language skills and fund of knowledge are appropriate. There is no evidence of aphasia, agnosia, apraxia or anomia. Speech is clear with normal prosody and enunciation. Thought process is linear. Mood is normal and affect is normal.  Cranial nerves II - XII are as described above under HEENT exam. In addition: shoulder shrug is normal with equal shoulder height noted. Motor exam: Normal bulk, strength and tone is noted. There is no drift, tremor or rebound. Romberg is negative. Fine motor skills and coordination: grossly intact.  Cerebellar testing: No dysmetria or intention tremor. There is no truncal or gait ataxia.  Sensory exam: intact to light touch in the upper and lower extremities.  Gait, station and balance: She stands easily. No veering to one side is noted. No leaning to one side is noted. Posture is age-appropriate and stance is narrow based. Gait shows normal stride length and normal pace. No problems turning are noted. Tandem walk is unremarkable.  Assessment and Plan:  In summary, JAKERRIA RATTERMAN is a very pleasant 48 y.o.-year old female with an underlying medical history of chronic low back pain, chronic kidney disease, depression and obesity, whose history and  physical exam are concerning for obstructive sleep apnea (OSA). I had a long chat with the patient about my findings and the diagnosis of OSA, its prognosis and treatment options. We talked about medical treatments, surgical interventions and non-pharmacological approaches. I explained in particular the risks and ramifications of untreated moderate to severe OSA, especially with respect to developing cardiovascular disease down the Road, including congestive heart failure, difficult to treat hypertension, cardiac arrhythmias, or stroke. Even type 2 diabetes has, in part, been linked to untreated OSA. Symptoms of untreated OSA include daytime sleepiness, memory problems, mood irritability and mood disorder such as depression and anxiety, lack of energy, as well as recurrent headaches, especially morning headaches. We talked about trying to maintain a healthy lifestyle in general, as well as the importance of weight control. We also talked about the importance of good sleep hygiene. I recommended the following at this time: sleep study.   I explained the sleep test procedure to the patient and also outlined possible surgical and non-surgical treatment options of OSA, including the use of a custom-made dental device (which would require a referral to a specialist dentist or oral surgeon), upper airway surgical options, such as traditional UPPP or a novel less invasive surgical option in the form of Inspire hypoglossal nerve stimulation (which would involve a referral to an ENT surgeon). I also explained the CPAP treatment option to the patient, who indicated that she would be willing to try CPAP if the need arises. I explained the importance of being compliant with PAP treatment, not only for insurance purposes but primarily to improve Her symptoms, and for the patient's long term health benefit, including to reduce Her cardiovascular risks. I answered all her questions today and the patient was in agreement. I  plan to see her back after the sleep study is completed and encouraged her to call with any interim questions, concerns, problems or updates.   Thank you very much for allowing me to participate in the care of this nice patient. If I can be of any further assistance to you please do not hesitate to call me at [phone].  Sincerely,   Star Age, MD, PhD

## 2019-06-27 DIAGNOSIS — N870 Mild cervical dysplasia: Secondary | ICD-10-CM | POA: Diagnosis not present

## 2019-06-27 DIAGNOSIS — N879 Dysplasia of cervix uteri, unspecified: Secondary | ICD-10-CM | POA: Diagnosis not present

## 2019-07-18 DIAGNOSIS — R351 Nocturia: Secondary | ICD-10-CM | POA: Diagnosis not present

## 2019-07-18 DIAGNOSIS — R0681 Apnea, not elsewhere classified: Secondary | ICD-10-CM | POA: Diagnosis not present

## 2019-07-18 DIAGNOSIS — F339 Major depressive disorder, recurrent, unspecified: Secondary | ICD-10-CM | POA: Diagnosis not present

## 2019-07-18 DIAGNOSIS — N870 Mild cervical dysplasia: Secondary | ICD-10-CM | POA: Diagnosis not present

## 2019-07-23 ENCOUNTER — Telehealth: Payer: Self-pay

## 2019-07-23 NOTE — Telephone Encounter (Signed)
We have attempted to call the patient two times to schedule sleep study.  Patient has been unavailable at the phone numbers we have on file and has not returned our calls.  At this point we will send a letter asking patient to please contact the sleep lab to schedule their sleep study.  If patient calls back we will schedule them for their sleep study. 

## 2019-08-29 DIAGNOSIS — F339 Major depressive disorder, recurrent, unspecified: Secondary | ICD-10-CM | POA: Diagnosis not present

## 2019-08-29 DIAGNOSIS — W19XXXA Unspecified fall, initial encounter: Secondary | ICD-10-CM | POA: Diagnosis not present

## 2019-09-12 ENCOUNTER — Ambulatory Visit (INDEPENDENT_AMBULATORY_CARE_PROVIDER_SITE_OTHER): Payer: BLUE CROSS/BLUE SHIELD | Admitting: Neurology

## 2019-09-12 DIAGNOSIS — R0683 Snoring: Secondary | ICD-10-CM

## 2019-09-12 DIAGNOSIS — R0681 Apnea, not elsewhere classified: Secondary | ICD-10-CM

## 2019-09-12 DIAGNOSIS — R351 Nocturia: Secondary | ICD-10-CM

## 2019-09-12 DIAGNOSIS — G4733 Obstructive sleep apnea (adult) (pediatric): Secondary | ICD-10-CM

## 2019-09-12 DIAGNOSIS — G4719 Other hypersomnia: Secondary | ICD-10-CM

## 2019-09-13 DIAGNOSIS — Z6841 Body Mass Index (BMI) 40.0 and over, adult: Secondary | ICD-10-CM | POA: Diagnosis not present

## 2019-09-17 NOTE — Procedures (Signed)
Patient Information     First Name: Allison Last Name: Terrell ID: [id number]  Birth Date: 05/13/1971 Age: 49 Gender: Female  Referring Provider: Deland Pretty, MD BMI: 42.2 (W=229 lb, H=5' 2'')  Neck Circ.:  15 '' Epworth:  14/24   Sleep Study Information    Study Date: Sep 12, 2019 S/H/A Version: 001.001.001.001 / 4.1.1528 / 40  History:    49 year old woman with a history of chronic low back pain, chronic kidney disease, depression and obesity, who reports snoring and excessive daytime somnolence, as well as witnessed apneas per her boyfriend's report. Summary & Diagnosis:     OSA Recommendations:     This home sleep test demonstrates mild obstructive sleep apnea with a total AHI of 13.6/hour and O2 nadir of 87%. Given the patient's medical history and sleep related complaints, treatment with positive airway pressure is recommended. The patient will be advised to proceed with an autoPAP titration/trial at home for now. Please note that untreated obstructive sleep apnea may carry additional perioperative morbidity. Patients with significant obstructive sleep apnea should receive perioperative PAP therapy and the surgeons and particularly the anesthesiologist should be informed of the diagnosis and the severity of the sleep disordered breathing. The patient should be cautioned not to drive, work at heights, or operate dangerous or heavy equipment when tired or sleepy. Review and reiteration of good sleep hygiene measures should be pursued with any patient. Other causes of the patient's symptoms, including circadian rhythm disturbances, an underlying mood disorder, medication effect and/or an underlying medical problem cannot be ruled out based on this test. Clinical correlation is recommended. The patient and her referring provider will be notified of the test results. The patient will be seen in follow up in sleep clinic at [facility].  I certify that I have reviewed the raw data recording prior to the issuance of  this report in accordance with the standards of the American Academy of Sleep Medicine (AASM).  Star Age, MD, PhD [facility]) Diplomat, ABPN (Neurology and Sleep)              Sleep Summary  Oxygen Saturation Statistics   Start Study Time: End Study Time: Total Recording Time:          10:56:57 PM 8:26:08 AM   9 h, 29 min  Total Sleep Time % REM of Sleep Time:  8 h, 39 min  32.8    Mean: 93 Minimum: 87 Maximum: 98  Mean of Desaturations Nadirs (%):   91  Oxygen Desaturation. %: 4-9 10-20 >20 Total  Events Number Total  52 100.0  0 0.0  0 0.0  52 100.0  Oxygen Saturation: <90 <=88 <85 <80 <70  Duration (minutes): Sleep % 3.3 1.0 0.6 0.2 0.0 0.0 0.0 0.0 0.0 0.0     Respiratory Indices      Total Events REM NREM All Night  pRDI:  117  pAHI:  97 ODI:  52  pAHIc:  0  % CSR: 0.0 30.6 28.1 18.1 0.0 8.8 5.8 1.5 0.0 16.4 13.6 7.3 0.0       Pulse Rate Statistics during Sleep (BPM)      Mean: 72 Minimum: 57 Maximum: 100    Indices are calculated using technically valid sleep time of 7 h, 9 min. Central-Indices are calculated using technically valid sleep time of 6  h, 50 min. pRDI/pAHI are calculated using oxi desaturations ? 3%  Body Position Statistics  Position Supine Prone Right Left Non-Supine  Sleep (min) 184.5 256.7 53.0 25.0 334.7  Sleep % 35.5 49.4 10.2 4.8 64.5  pRDI 26.5 10.4 15.9 32.8 12.8  pAHI 22.2 9.1 14.7 16.4 10.6  ODI 11.4 4.2 13.6 5.5 5.8     Snoring Statistics Snoring Level (dB) >40 >50 >60 >70 >80 >Threshold (45)  Sleep (min) 359.8 59.9 3.9 0.3 0.0 132.0  Sleep % 69.3 11.5 0.7 0.1 0.0 25.4    Mean: 44 dB Sleep Stages Chart                     pAHI=13.6                                 Mild              Moderate                    Severe                                                 5              15                    30

## 2019-09-17 NOTE — Addendum Note (Signed)
Addended by: Star Age on: 09/17/2019 06:00 PM   Modules accepted: Orders

## 2019-09-17 NOTE — Progress Notes (Signed)
Patient referred by Dr. Shelia Media, seen by me on 06/18/19, HST on 09/12/19.    Please call and notify the patient that the recent home sleep test showed obstructive sleep apnea. OSA is overall mild, but worth treating to see if she feels better after treatment. To that end I recommend treatment for this in the form of autoPAP, which means, that we don't have to bring her in for a sleep study with CPAP, but will let her try an autoPAP machine at home, through a DME company (of her choice, or as per insurance requirement). The DME representative will educate her on how to use the machine, how to put the mask on, etc. I have placed an order in the chart. Please send referral, talk to patient, send report to referring MD. We will need a FU in sleep clinic for 10 weeks post-PAP set up, please arrange that with me or one of our NPs. Thanks,   Star Age, MD, PhD [facility])

## 2019-09-18 ENCOUNTER — Telehealth: Payer: Self-pay

## 2019-09-18 NOTE — Telephone Encounter (Signed)
I reached out to the pt and left a vm asking for a call back.

## 2019-09-18 NOTE — Telephone Encounter (Signed)
-----   Message from Star Age, MD sent at 09/17/2019  6:00 PM EST ----- Patient referred by Dr. Shelia Media, seen by me on 06/18/19, HST on 09/12/19.    Please call and notify the patient that the recent home sleep test showed obstructive sleep apnea. OSA is overall mild, but worth treating to see if she feels better after treatment. To that end I recommend treatment for this in the form of autoPAP, which means, that we don't have to bring her in for a sleep study with CPAP, but will let her try an autoPAP machine at home, through a DME company (of her choice, or as per insurance requirement). The DME representative will educate her on how to use the machine, how to put the mask on, etc. I have placed an order in the chart. Please send referral, talk to patient, send report to referring MD. We will need a FU in sleep clinic for 10 weeks post-PAP set up, please arrange that with me or one of our NPs. Thanks,   Star Age, MD, PhD [facility])

## 2019-09-24 NOTE — Telephone Encounter (Signed)
I reached out to the pt and left a vm requesting a call back to discuss sleep study results. This was the second vm I have left.

## 2019-10-18 ENCOUNTER — Encounter: Payer: Self-pay | Admitting: Neurology

## 2019-10-18 NOTE — Telephone Encounter (Signed)
Pt returned call. I advised pt that Dr. Rexene Alberts reviewed their sleep study results and found that pt has mild sleep apnea. Dr. Rexene Alberts recommends that pt starts auto CPAP. I reviewed PAP compliance expectations with the pt. Pt is agreeable to starting a CPAP. I advised pt that an order will be sent to a DME, [facility], and [facility] will call the pt within about one week after they file with the pt's insurance. [facility] will show the pt how to use the machine, fit for masks, and troubleshoot the CPAP if needed. A follow up appt was made for insurance purposes with Ward Givens, NP on May 26,2021 at 1:00 pm. Pt verbalized understanding to arrive 15 minutes early and bring their CPAP. A letter with all of this information in it will be mailed to the pt as a reminder. I verified with the pt that the address we have on file is correct. Pt verbalized understanding of results. Pt had no questions at this time but was encouraged to call back if questions arise. I have sent the order to [facility] and have received confirmation that they have received the order.

## 2019-10-18 NOTE — Telephone Encounter (Signed)
Called the patient back to discuss HST results with her. LVM advising the patient to call back LVM informing here today til 5 and wont be back in until Monday. Will wait for her call.

## 2019-10-18 NOTE — Telephone Encounter (Signed)
Pt called back in regards to her sleep study results please FU

## 2019-11-20 DIAGNOSIS — G4733 Obstructive sleep apnea (adult) (pediatric): Secondary | ICD-10-CM | POA: Diagnosis not present

## 2019-12-20 DIAGNOSIS — G4733 Obstructive sleep apnea (adult) (pediatric): Secondary | ICD-10-CM | POA: Diagnosis not present

## 2019-12-27 ENCOUNTER — Ambulatory Visit: Payer: BLUE CROSS/BLUE SHIELD | Admitting: Adult Health

## 2019-12-27 ENCOUNTER — Other Ambulatory Visit: Payer: Self-pay

## 2019-12-27 ENCOUNTER — Encounter: Payer: Self-pay | Admitting: Adult Health

## 2019-12-27 VITALS — BP 124/76 | HR 72 | Ht 62.0 in | Wt 228.0 lb

## 2019-12-27 DIAGNOSIS — Z9989 Dependence on other enabling machines and devices: Secondary | ICD-10-CM

## 2019-12-27 DIAGNOSIS — G4733 Obstructive sleep apnea (adult) (pediatric): Secondary | ICD-10-CM | POA: Diagnosis not present

## 2019-12-27 NOTE — Progress Notes (Addendum)
[facility] [address] [place]. [place] [address] [phone]       OFFICE FOLLOW UP NOTE  [name] Date of Birth:  Oct 16, 1970 Medical Record Number:  [id number]   Reason for visit: Initial CPAP compliance visit    SUBJECTIVE:   CHIEF COMPLAINT:  Chief Complaint  Patient presents with  . Follow-up    cpap fu, alone, pt states she needs a new mask     HPI:   Allison Terrell is a 49 year old female with underlying history of chronic low back pain, chronic kidney disease, depression and obesity who was recently evaluated by Dr. Rexene Alberts for possible sleep apnea due to complaints of snoring, excessive daytime somnolence and witnessed apneas. Underwent home sleep test on 09/12/2019 which showed mild obstructive sleep apnea with total AHI of 13.6/h and O2 nadir of 87%.  Recommended initiation of AutoPap titration/trial at home for now.  Review of compliance report from 11/27/2019 -12/26/2019 shows 27 out of 30 usage days with 27 days greater than 4 hours for 90% compliance.  Average usage 7 hours and 45 minutes.  Residual AHI 2.7.  Leaks in the 95th percentile 30.3.  Pressure in the 95th percentile 10.7 onset pressure 6 and max pressure 12 with EPR level 3.  She initially was tolerating well but over the past week has been having greater difficulty with sealing of the mask and leaking.  She is wondering if she needs a new mask.  She has not followed up with DME company [facility].  Otherwise, she is tolerating well and feels as though she is sleeping better throughout the night.  Epworth sleepiness score also improved currently 9/24 where previously 14/24 and fatigue severity scale currently 38/63 previously 45/63.  No concerns at this time.      ROS:   14 system review of systems performed and negative with exception of apnea  Epworth Sleepiness Scale How likely are you to doze in the following situations:  0 = not likely, 1 = slight chance, 2 = moderate  chance, 3 = high chance   Sitting and Reading? 2  Watching Television? 3 Sitting inactive in a public place (theater or meeting)? 1 Lying down in the afternoon when circumstances permit? 2 Sitting and talking to someone? 0 Sitting quietly after lunch without alcohol? 0 In a car, while stopped for a few minutes in traffic? 0 As a passenger in a car for an hour without a break? 1  Total = 9     PMH:  Past Medical History:  Diagnosis Date  . Chronic kidney disease     PSH:  Past Surgical History:  Procedure Laterality Date  . CESAREAN SECTION    . ENDOMETRIAL ABLATION      Social History:  Social History   Socioeconomic History  . Marital status: Single    Spouse name: Not on file  . Number of children: Not on file  . Years of education: Not on file  . Highest education level: Not on file  Occupational History  . Not on file  Tobacco Use  . Smoking status: Former Research scientist (life sciences)  . Smokeless tobacco: Never Used  Substance and Sexual Activity  . Alcohol use: Yes    Comment: rare  . Drug use: No  . Sexual activity: Not on file  Other Topics Concern  . Not on file  Social History Narrative  . Not on file   Social Determinants of Health   Financial Resource Strain:   .  Difficulty of Paying Living Expenses:   Food Insecurity:   . Worried About Charity fundraiser in the Last Year:   . Arboriculturist in the Last Year:   Transportation Needs:   . Film/video editor (Medical):   [name] Lack of Transportation (Non-Medical):   Physical Activity:   . Days of Exercise per Week:   . Minutes of Exercise per Session:   Stress:   . Feeling of Stress :   Social Connections:   . Frequency of Communication with Friends and Family:   . Frequency of Social Gatherings with Friends and Family:   . Attends Religious Services:   . Active Member of Clubs or Organizations:   . Attends Archivist Meetings:   [name] Marital Status:   Intimate Partner Violence:   . Fear of  Current or Ex-Partner:   . Emotionally Abused:   [name] Physically Abused:   . Sexually Abused:     Family History:  Family History  Problem Relation Age of Onset  . Lung cancer Mother   . Heart disease Mother   . Lung cancer Father   . Heart disease Father   . Asthma Sister     Medications:   Current Outpatient Medications on File Prior to Visit  Medication Sig Dispense Refill  . buPROPion (WELLBUTRIN XL) 150 MG 24 hr tablet     . chlorhexidine (PERIDEX) 0.12 % solution RINSE 2 TIMES A DAY ONCE IN THE MORNING AND BEFORE BEDTIME FOR 30 SECONDS     No current facility-administered medications on file prior to visit.    Allergies:  No Known Allergies    OBJECTIVE:  Physical Exam  Vitals:   12/27/19 1437  BP: 124/76  Pulse: 72  Weight: 228 lb (103.4 kg)  Height: 5\' 2"  (1.575 m)   Body mass index is 41.7 kg/m. No exam data present  General: well developed, well nourished,  pleasant middle-age Caucasian female, seated, in no evident distress Head: head normocephalic and atraumatic.   Neck: supple with no carotid or supraclavicular bruits Cardiovascular: regular rate and rhythm, no murmurs Musculoskeletal: no deformity Skin:  no rash/petichiae Vascular:  Normal pulses all extremities   Neurologic Exam Mental Status: Awake and fully alert. Oriented to place and time. Recent and remote memory intact. Attention span, concentration and fund of knowledge appropriate. Mood and affect appropriate.  Cranial Nerves: Pupils equal, briskly reactive to light. Extraocular movements full without nystagmus. Visual fields full to confrontation. Hearing intact. Facial sensation intact. Face, tongue, palate moves normally and symmetrically.  Motor: Normal bulk and tone. Normal strength in all tested extremity muscles. Sensory.: intact to touch , pinprick , position and vibratory sensation.  Coordination: Rapid alternating movements normal in all extremities. Finger-to-nose and  heel-to-shin performed accurately bilaterally. Gait and Station: Arises from chair without difficulty. Stance is normal. Gait demonstrates normal stride length and balance Reflexes: 1+ and symmetric. Toes downgoing.       ASSESSMENT/PLAN: KAO MCKEONE is a 49 y.o. year old female evaluated by Dr. Rexene Alberts on 06/18/2019 for possible sleep apnea and underwent HST on 09/12/2019 which showed mild obstructive sleep apnea and recommended initiation of AutoPap.  She is here today for initial compliance visit.  Satisfactory compliance with optimal residual AHI of 2.7.  She does report difficulty with mask seal otherwise tolerating well with improvement of fatigue and sleeping throughout the night  Order placed to [facility] for mask refitting and possibly a different mask  options.  Discussed importance of ongoing compliance for cardiovascular and stroke risk as well as improvement of daytime fatigue and overall sleep.  She will continue to follow with DME company with any needed supplies or CPAP concerns.    Follow up in 6 months or call earlier if needed   I spent 20 minutes of face-to-face and non-face-to-face time with patient.  This included previsit chart review, lab review, study review, order entry, electronic health record documentation, patient education    Frann Rider, [facility]  [facility] [address] [place] [place], [place] [address]  Phone [phone] Fax [phone] Note: This document was prepared with digital dictation and possible smart phrase technology. Any transcriptional errors that result from this process are unintentional.    I reviewed the above note and documentation by the Nurse Practitioner and agree with the history, exam, assessment and plan as outlined above. I was available for consultation. Star Age, MD, PhD [facility])

## 2019-12-27 NOTE — Patient Instructions (Signed)
Your Plan:  Continue ongoing use of CPAP as your apnea is being treated well  Order placed to DME company for further discussion regarding mask use   Follow up in 6 months or call earlier if needed     Thank you for coming to see [name] at [facility]. I hope we have been able to provide you high quality care today.  You may receive a patient satisfaction survey over the next few weeks. We would appreciate your feedback and comments so that we may continue to improve ourselves and the health of our patients.

## 2019-12-27 NOTE — Progress Notes (Signed)
Order for mask refitting sent to [facility] via [employer]. Confirmation received that the order transmitted was successful.

## 2020-01-02 ENCOUNTER — Ambulatory Visit: Payer: Self-pay | Admitting: Adult Health

## 2020-01-16 DIAGNOSIS — N870 Mild cervical dysplasia: Secondary | ICD-10-CM | POA: Diagnosis not present

## 2020-01-16 DIAGNOSIS — R87612 Low grade squamous intraepithelial lesion on cytologic smear of cervix (LGSIL): Secondary | ICD-10-CM | POA: Diagnosis not present

## 2020-01-20 DIAGNOSIS — G4733 Obstructive sleep apnea (adult) (pediatric): Secondary | ICD-10-CM | POA: Diagnosis not present

## 2020-04-16 DIAGNOSIS — R87612 Low grade squamous intraepithelial lesion on cytologic smear of cervix (LGSIL): Secondary | ICD-10-CM | POA: Diagnosis not present

## 2020-04-16 DIAGNOSIS — Z0142 Encounter for cervical smear to confirm findings of recent normal smear following initial abnormal smear: Secondary | ICD-10-CM | POA: Diagnosis not present

## 2020-04-16 DIAGNOSIS — Z8742 Personal history of other diseases of the female genital tract: Secondary | ICD-10-CM | POA: Diagnosis not present

## 2020-04-30 DIAGNOSIS — G4733 Obstructive sleep apnea (adult) (pediatric): Secondary | ICD-10-CM | POA: Diagnosis not present

## 2020-06-02 DIAGNOSIS — R5383 Other fatigue: Secondary | ICD-10-CM | POA: Diagnosis not present

## 2020-06-02 DIAGNOSIS — Z0000 Encounter for general adult medical examination without abnormal findings: Secondary | ICD-10-CM | POA: Diagnosis not present

## 2020-06-02 DIAGNOSIS — Z13220 Encounter for screening for lipoid disorders: Secondary | ICD-10-CM | POA: Diagnosis not present

## 2020-06-12 DIAGNOSIS — G43109 Migraine with aura, not intractable, without status migrainosus: Secondary | ICD-10-CM | POA: Diagnosis not present

## 2020-06-12 DIAGNOSIS — Z0001 Encounter for general adult medical examination with abnormal findings: Secondary | ICD-10-CM | POA: Diagnosis not present

## 2020-06-12 DIAGNOSIS — F339 Major depressive disorder, recurrent, unspecified: Secondary | ICD-10-CM | POA: Diagnosis not present

## 2020-06-12 DIAGNOSIS — Z205 Contact with and (suspected) exposure to viral hepatitis: Secondary | ICD-10-CM | POA: Diagnosis not present

## 2020-06-12 DIAGNOSIS — Z6841 Body Mass Index (BMI) 40.0 and over, adult: Secondary | ICD-10-CM | POA: Diagnosis not present

## 2020-06-27 DIAGNOSIS — G4733 Obstructive sleep apnea (adult) (pediatric): Secondary | ICD-10-CM | POA: Diagnosis not present

## 2020-06-30 ENCOUNTER — Telehealth: Payer: BLUE CROSS/BLUE SHIELD | Admitting: Adult Health

## 2020-07-09 DIAGNOSIS — Z6841 Body Mass Index (BMI) 40.0 and over, adult: Secondary | ICD-10-CM | POA: Diagnosis not present

## 2020-07-09 DIAGNOSIS — Z01419 Encounter for gynecological examination (general) (routine) without abnormal findings: Secondary | ICD-10-CM | POA: Diagnosis not present

## 2020-07-09 DIAGNOSIS — Z1231 Encounter for screening mammogram for malignant neoplasm of breast: Secondary | ICD-10-CM | POA: Diagnosis not present

## 2020-07-14 ENCOUNTER — Ambulatory Visit: Payer: BLUE CROSS/BLUE SHIELD | Admitting: Adult Health

## 2020-07-14 NOTE — Progress Notes (Deleted)
[facility] [address] [place]. [place] [address] [phone]       OFFICE FOLLOW UP NOTE  Allison Terrell Date of Birth:  01/12/1971 Medical Record Number:  [id number]   Reason for visit: Initial CPAP compliance visit    SUBJECTIVE:   CHIEF COMPLAINT:  No chief complaint on file.   HPI:   Allison Terrell is a 49 year old female with underlying history of chronic low back pain, chronic kidney disease, depression and obesity who was recently evaluated by Dr. Rexene Alberts for possible sleep apnea due to complaints of snoring, excessive daytime somnolence and witnessed apneas. Underwent home sleep test on 09/12/2019 which showed mild obstructive sleep apnea with total AHI of 13.6/h and O2 nadir of 87%.  Recommended initiation of AutoPap titration/trial at home for now.  Review of compliance report from 11/27/2019 -12/26/2019 shows 27 out of 30 usage days with 27 days greater than 4 hours for 90% compliance.  Average usage 7 hours and 45 minutes.  Residual AHI 2.7.  Leaks in the 95th percentile 30.3.  Pressure in the 95th percentile 10.7 onset pressure 6 and max pressure 12 with EPR level 3.  She initially was tolerating well but over the past week has been having greater difficulty with sealing of the mask and leaking.  She is wondering if she needs a new mask.  She has not followed up with DME company [facility].  Otherwise, she is tolerating well and feels as though she is sleeping better throughout the night.  Epworth sleepiness score also improved currently 9/24 where previously 14/24 and fatigue severity scale currently 38/63 previously 45/63.  No concerns at this time.      ROS:   14 system review of systems performed and negative with exception of apnea  Epworth Sleepiness Scale How likely are you to doze in the following situations:  0 = not likely, 1 = slight chance, 2 = moderate chance, 3 = high chance   Sitting and Reading? 2  Watching Television? 3 Sitting  inactive in a public place (theater or meeting)? 1 Lying down in the afternoon when circumstances permit? 2 Sitting and talking to someone? 0 Sitting quietly after lunch without alcohol? 0 In a car, while stopped for a few minutes in traffic? 0 As a passenger in a car for an hour without a break? 1  Total = 9     PMH:  Past Medical History:  Diagnosis Date  . Chronic kidney disease     PSH:  Past Surgical History:  Procedure Laterality Date  . CESAREAN SECTION    . ENDOMETRIAL ABLATION      Social History:  Social History   Socioeconomic History  . Marital status: Single    Spouse name: Not on file  . Number of children: Not on file  . Years of education: Not on file  . Highest education level: Not on file  Occupational History  . Not on file  Tobacco Use  . Smoking status: Former Research scientist (life sciences)  . Smokeless tobacco: Never Used  Substance and Sexual Activity  . Alcohol use: Yes    Comment: rare  . Drug use: No  . Sexual activity: Not on file  Other Topics Concern  . Not on file  Social History Narrative  . Not on file   Social Determinants of Health   Financial Resource Strain:   . Difficulty of Paying Living Expenses: Not on file  Food Insecurity:   . Worried About Estate manager/land agent  of Food in the Last Year: Not on file  . Ran Out of Food in the Last Year: Not on file  Transportation Needs:   . Lack of Transportation (Medical): Not on file  . Lack of Transportation (Non-Medical): Not on file  Physical Activity:   . Days of Exercise per Week: Not on file  . Minutes of Exercise per Session: Not on file  Stress:   . Feeling of Stress : Not on file  Social Connections:   . Frequency of Communication with Friends and Family: Not on file  . Frequency of Social Gatherings with Friends and Family: Not on file  . Attends Religious Services: Not on file  . Active Member of Clubs or Organizations: Not on file  . Attends Archivist Meetings: Not on file  .  Marital Status: Not on file  Intimate Partner Violence:   . Fear of Current or Ex-Partner: Not on file  . Emotionally Abused: Not on file  . Physically Abused: Not on file  . Sexually Abused: Not on file    Family History:  Family History  Problem Relation Age of Onset  . Lung cancer Mother   . Heart disease Mother   . Lung cancer Father   . Heart disease Father   . Asthma Sister     Medications:   Current Outpatient Medications on File Prior to Visit  Medication Sig Dispense Refill  . buPROPion (WELLBUTRIN XL) 150 MG 24 hr tablet     . chlorhexidine (PERIDEX) 0.12 % solution RINSE 2 TIMES A DAY ONCE IN THE MORNING AND BEFORE BEDTIME FOR 30 SECONDS     No current facility-administered medications on file prior to visit.    Allergies:  No Known Allergies    OBJECTIVE:  Physical Exam  There were no vitals filed for this visit. There is no height or weight on file to calculate BMI. No exam data present  General: well developed, well nourished,  pleasant middle-age Caucasian female, seated, in no evident distress Head: head normocephalic and atraumatic.   Neck: supple with no carotid or supraclavicular bruits Cardiovascular: regular rate and rhythm, no murmurs Musculoskeletal: no deformity Skin:  no rash/petichiae Vascular:  Normal pulses all extremities   Neurologic Exam Mental Status: Awake and fully alert. Oriented to place and time. Recent and remote memory intact. Attention span, concentration and fund of knowledge appropriate. Mood and affect appropriate.  Cranial Nerves: Pupils equal, briskly reactive to light. Extraocular movements full without nystagmus. Visual fields full to confrontation. Hearing intact. Facial sensation intact. Face, tongue, palate moves normally and symmetrically.  Motor: Normal bulk and tone. Normal strength in all tested extremity muscles. Sensory.: intact to touch , pinprick , position and vibratory sensation.  Coordination: Rapid  alternating movements normal in all extremities. Finger-to-nose and heel-to-shin performed accurately bilaterally. Gait and Station: Arises from chair without difficulty. Stance is normal. Gait demonstrates normal stride length and balance Reflexes: 1+ and symmetric. Toes downgoing.       ASSESSMENT/PLAN: Allison Terrell is a 49 y.o. year old female evaluated by Dr. Rexene Alberts on 06/18/2019 for possible sleep apnea and underwent HST on 09/12/2019 which showed mild obstructive sleep apnea and recommended initiation of AutoPap.  She is here today for initial compliance visit.  Satisfactory compliance with optimal residual AHI of 2.7.  She does report difficulty with mask seal otherwise tolerating well with improvement of fatigue and sleeping throughout the night  Order placed to DME company [facility] for mask  refitting and possibly a different mask options.  Discussed importance of ongoing compliance for cardiovascular and stroke risk as well as improvement of daytime fatigue and overall sleep.  She will continue to follow with DME company with any needed supplies or CPAP concerns.    Follow up in 6 months or call earlier if needed   I spent 20 minutes of face-to-face and non-face-to-face time with patient.  This included previsit chart review, lab review, study review, order entry, electronic health record documentation, patient education    Frann Rider, [facility]  [facility] [address] [place], [place] [address]  Phone [phone] Fax [phone] Note: This document was prepared with digital dictation and possible smart phrase technology. Any transcriptional errors that result from this process are unintentional.    I reviewed the above note and documentation by the Nurse Practitioner and agree with the history, exam, assessment and plan as outlined above. I was available for consultation. Star Age, MD, PhD [facility])

## 2020-07-15 ENCOUNTER — Encounter: Payer: Self-pay | Admitting: Adult Health

## 2020-07-15 ENCOUNTER — Ambulatory Visit: Payer: BLUE CROSS/BLUE SHIELD | Admitting: Adult Health

## 2020-07-15 VITALS — BP 132/84 | HR 74 | Ht 62.0 in | Wt 230.0 lb

## 2020-07-15 DIAGNOSIS — Z9989 Dependence on other enabling machines and devices: Secondary | ICD-10-CM

## 2020-07-15 DIAGNOSIS — G4733 Obstructive sleep apnea (adult) (pediatric): Secondary | ICD-10-CM

## 2020-07-15 NOTE — Progress Notes (Addendum)
[facility] [address] [place] [id number] [phone]       OFFICE FOLLOW UP NOTE  [name] Date of Birth:  [birth year] Medical Record Number:  [id number]   Reason for visit: CPAP compliance visit    SUBJECTIVE:   CHIEF COMPLAINT:  Chief Complaint  Patient presents with  . Follow-up    cpap fu, tx rm, alone, doing well with cpap     HPI:   Today, on 07/15/2020, [name] returns for CPAP follow-up.  Reports improved tolerance with current CPAP mask.  Prior difficulty tolerating in place order for mask refitting but apparently she has had multiple insurance difficulties working with DME [employer] now [employer].  ESS 11 (prior 14) and FSS 35 (prior 38).  Compliance report from 06/14/2020 -07/13/2020 shows 26 out of 30 usage days with 26 days greater than 4 hours for 87% compliance.  Average usage 8 hours and 5 minutes.  Residual AHI 2.3 with current settings min pressure 6 and max pressure 12 with a EPR level 3.  Pressure in the 95th percentile 11.8.  Leaks in the 95 percentile 25.0.     History provided for reference purposes only Update 12/27/2019 JM: Allison Terrell is a 49 year old female with underlying history of chronic low back pain, chronic kidney disease, depression and obesity who was recently evaluated by Dr. Rexene Alberts for possible sleep apnea due to complaints of snoring, excessive daytime somnolence and witnessed apneas. Underwent home sleep test on 09/12/2019 which showed mild obstructive sleep apnea with total AHI of 13.6/h and O2 nadir of 87%.  Recommended initiation of AutoPap titration/trial at home for now.  Review of compliance report from 11/27/2019 -12/26/2019 shows 27 out of 30 usage days with 27 days greater than 4 hours for 90% compliance.  Average usage 7 hours and 45 minutes.  Residual AHI 2.7.  Leaks in the 95th percentile 30.3.  Pressure in the 95th percentile 10.7 onset pressure 6 and max pressure 12 with EPR level 3.  She  initially was tolerating well but over the past week has been having greater difficulty with sealing of the mask and leaking.  She is wondering if she needs a new mask.  She has not followed up with DME company [employer].  Otherwise, she is tolerating well and feels as though she is sleeping better throughout the night.  Epworth sleepiness score also improved currently 9/24 where previously 14/24 and fatigue severity scale currently 38/63 previously 45/63.  No concerns at this time.      ROS:   14 system review of systems performed and negative with exception of apnea     PMH:  Past Medical History:  Diagnosis Date  . Chronic kidney disease     PSH:  Past Surgical History:  Procedure Laterality Date  . CESAREAN SECTION    . ENDOMETRIAL ABLATION      Social History:  Social History   Socioeconomic History  . Marital status: Single    Spouse name: Not on file  . Number of children: Not on file  . Years of education: Not on file  . Highest education level: Not on file  Occupational History  . Not on file  Tobacco Use  . Smoking status: Former Research scientist (life sciences)  . Smokeless tobacco: Never Used  Substance and Sexual Activity  . Alcohol use: Yes    Comment: rare  . Drug use: No  . Sexual activity: Not on file  Other Topics Concern  .  Not on file  Social History Narrative  . Not on file   Social Determinants of Health   Financial Resource Strain:   . Difficulty of Paying Living Expenses: Not on file  Food Insecurity:   . Worried About Charity fundraiser in the Last Year: Not on file  . Ran Out of Food in the Last Year: Not on file  Transportation Needs:   . Lack of Transportation (Medical): Not on file  . Lack of Transportation (Non-Medical): Not on file  Physical Activity:   . Days of Exercise per Week: Not on file  . Minutes of Exercise per Session: Not on file  Stress:   . Feeling of Stress : Not on file  Social Connections:   . Frequency of Communication with  Friends and Family: Not on file  . Frequency of Social Gatherings with Friends and Family: Not on file  . Attends Religious Services: Not on file  . Active Member of Clubs or Organizations: Not on file  . Attends Archivist Meetings: Not on file  . Marital Status: Not on file  Intimate Partner Violence:   . Fear of Current or Ex-Partner: Not on file  . Emotionally Abused: Not on file  . Physically Abused: Not on file  . Sexually Abused: Not on file    Family History:  Family History  Problem Relation Age of Onset  . Lung cancer Mother   . Heart disease Mother   . Lung cancer Father   . Heart disease Father   . Asthma Sister     Medications:   Current Outpatient Medications on File Prior to Visit  Medication Sig Dispense Refill  . buPROPion (WELLBUTRIN XL) 150 MG 24 hr tablet     . chlorhexidine (PERIDEX) 0.12 % solution RINSE 2 TIMES A DAY ONCE IN THE MORNING AND BEFORE BEDTIME FOR 30 SECONDS     No current facility-administered medications on file prior to visit.    Allergies:  No Known Allergies    OBJECTIVE:  Physical Exam  Vitals:   07/15/20 1447  BP: 132/84  Pulse: 74  Weight: 230 lb (104.3 kg)  Height: 5\' 2"  (1.575 m)   Body mass index is 42.07 kg/m. No exam data present  General: well developed, well nourished,  pleasant middle-age Caucasian female, seated, in no evident distress Head: head normocephalic and atraumatic.   Neck: supple with no carotid or supraclavicular bruits Cardiovascular: regular rate and rhythm, no murmurs Musculoskeletal: no deformity Skin:  no rash/petichiae Vascular:  Normal pulses all extremities   Neurologic Exam Mental Status: Awake and fully alert. Oriented to place and time. Recent and remote memory intact. Attention span, concentration and fund of knowledge appropriate. Mood and affect appropriate.  Cranial Nerves: Pupils equal, briskly reactive to light. Extraocular movements full without nystagmus. Visual  fields full to confrontation. Hearing intact. Facial sensation intact. Face, tongue, palate moves normally and symmetrically.  Motor: Normal bulk and tone. Normal strength in all tested extremity muscles. Sensory.: intact to touch , pinprick , position and vibratory sensation.  Coordination: Rapid alternating movements normal in all extremities. Finger-to-nose and heel-to-shin performed accurately bilaterally. Gait and Station: Arises from chair without difficulty. Stance is normal. Gait demonstrates normal stride length and balance Reflexes: 1+ and symmetric. Toes downgoing.       ASSESSMENT/PLAN: NARYA BEAVIN is a 49 y.o. year old female evaluated by Dr. Rexene Alberts on 06/18/2019 for possible sleep apnea and underwent HST on 09/12/2019 which showed mild  obstructive sleep apnea and recommended initiation of AutoPap.   Satisfactory compliance with optimal residual AHI 2.3.  Continue current settings with min pressure 6 and max pressure 12 with a EPR level 3.  Order will be placed to [place] for any needed supplies and advised to follow with DME company for CPAP related concerns.  Multiple concerns regarding current DME company [employer] which has since transitioned to [employer] [facility] and difficulty with insurance. Will f/u with Dr. Tori Milks nurse Cyril Mourning, RN to see if she can further look into this.    Follow-up in 1 year or call earlier if needed   CC:  [facility] provider: Dr. Hyacinth Meeker, Thayer Jew, MD    I spent 20 minutes of face-to-face and non-face-to-face time with patient.  This included previsit chart review, lab review, study review, order entry, electronic health record documentation, patient education and discussion regarding ongoing use of CPAP, reviewing discussion of compliance report, DME and insurance company concerns and answered all other questions to patient satisfaction   Frann Rider, [facility]  [facility] [address] [place], [place]  [address]  Phone [phone] Fax [phone] Note: This document was prepared with digital dictation and possible smart phrase technology. Any transcriptional errors that result from this process are unintentional.  I reviewed the above note and documentation by the Nurse Practitioner and agree with the history, exam, assessment and plan as outlined above. I was available for consultation. Star Age, MD, PhD [facility])

## 2020-07-21 DIAGNOSIS — G4733 Obstructive sleep apnea (adult) (pediatric): Secondary | ICD-10-CM | POA: Diagnosis not present

## 2020-08-20 ENCOUNTER — Encounter (INDEPENDENT_AMBULATORY_CARE_PROVIDER_SITE_OTHER): Payer: Self-pay

## 2020-08-26 ENCOUNTER — Encounter (INDEPENDENT_AMBULATORY_CARE_PROVIDER_SITE_OTHER): Payer: Self-pay

## 2020-08-28 ENCOUNTER — Encounter (INDEPENDENT_AMBULATORY_CARE_PROVIDER_SITE_OTHER): Payer: Self-pay | Admitting: Bariatrics

## 2020-08-28 DIAGNOSIS — M545 Low back pain, unspecified: Secondary | ICD-10-CM | POA: Insufficient documentation

## 2020-08-28 DIAGNOSIS — E669 Obesity, unspecified: Secondary | ICD-10-CM | POA: Insufficient documentation

## 2020-08-28 DIAGNOSIS — Z9989 Dependence on other enabling machines and devices: Secondary | ICD-10-CM | POA: Insufficient documentation

## 2020-08-28 DIAGNOSIS — N189 Chronic kidney disease, unspecified: Secondary | ICD-10-CM | POA: Insufficient documentation

## 2020-08-28 DIAGNOSIS — G8929 Other chronic pain: Secondary | ICD-10-CM | POA: Insufficient documentation

## 2020-08-28 DIAGNOSIS — G4733 Obstructive sleep apnea (adult) (pediatric): Secondary | ICD-10-CM | POA: Insufficient documentation

## 2020-08-28 DIAGNOSIS — G4730 Sleep apnea, unspecified: Secondary | ICD-10-CM | POA: Insufficient documentation

## 2020-09-01 ENCOUNTER — Encounter (INDEPENDENT_AMBULATORY_CARE_PROVIDER_SITE_OTHER): Payer: Self-pay | Admitting: Bariatrics

## 2020-09-01 ENCOUNTER — Ambulatory Visit (INDEPENDENT_AMBULATORY_CARE_PROVIDER_SITE_OTHER): Payer: Managed Care, Other (non HMO) | Admitting: Bariatrics

## 2020-09-01 ENCOUNTER — Other Ambulatory Visit: Payer: Self-pay

## 2020-09-01 VITALS — BP 118/76 | HR 79 | Temp 98.1°F | Ht 62.0 in | Wt 235.0 lb

## 2020-09-01 DIAGNOSIS — G4733 Obstructive sleep apnea (adult) (pediatric): Secondary | ICD-10-CM

## 2020-09-01 DIAGNOSIS — M545 Low back pain, unspecified: Secondary | ICD-10-CM

## 2020-09-01 DIAGNOSIS — G8929 Other chronic pain: Secondary | ICD-10-CM

## 2020-09-01 DIAGNOSIS — Z1331 Encounter for screening for depression: Secondary | ICD-10-CM

## 2020-09-01 DIAGNOSIS — Z0289 Encounter for other administrative examinations: Secondary | ICD-10-CM

## 2020-09-01 DIAGNOSIS — R5383 Other fatigue: Secondary | ICD-10-CM | POA: Diagnosis not present

## 2020-09-01 DIAGNOSIS — Z9189 Other specified personal risk factors, not elsewhere classified: Secondary | ICD-10-CM

## 2020-09-01 DIAGNOSIS — R7309 Other abnormal glucose: Secondary | ICD-10-CM

## 2020-09-01 DIAGNOSIS — G43809 Other migraine, not intractable, without status migrainosus: Secondary | ICD-10-CM

## 2020-09-01 DIAGNOSIS — N189 Chronic kidney disease, unspecified: Secondary | ICD-10-CM

## 2020-09-01 DIAGNOSIS — R0602 Shortness of breath: Secondary | ICD-10-CM | POA: Diagnosis not present

## 2020-09-01 DIAGNOSIS — Z9989 Dependence on other enabling machines and devices: Secondary | ICD-10-CM

## 2020-09-01 DIAGNOSIS — Z6841 Body Mass Index (BMI) 40.0 and over, adult: Secondary | ICD-10-CM

## 2020-09-01 DIAGNOSIS — Z87442 Personal history of urinary calculi: Secondary | ICD-10-CM

## 2020-09-02 ENCOUNTER — Encounter (INDEPENDENT_AMBULATORY_CARE_PROVIDER_SITE_OTHER): Payer: Self-pay | Admitting: Bariatrics

## 2020-09-02 LAB — CBC WITH DIFFERENTIAL/PLATELET
Basophils Absolute: 0.1 10*3/uL (ref 0.0–0.2)
Basos: 1 %
EOS (ABSOLUTE): 0.2 10*3/uL (ref 0.0–0.4)
Eos: 2 %
Hematocrit: 40 % (ref 34.0–46.6)
Hemoglobin: 12.9 g/dL (ref 11.1–15.9)
Immature Grans (Abs): 0.1 10*3/uL (ref 0.0–0.1)
Immature Granulocytes: 1 %
Lymphocytes Absolute: 3.1 10*3/uL (ref 0.7–3.1)
Lymphs: 33 %
MCH: 29.3 pg (ref 26.6–33.0)
MCHC: 32.3 g/dL (ref 31.5–35.7)
MCV: 91 fL (ref 79–97)
Monocytes Absolute: 0.5 10*3/uL (ref 0.1–0.9)
Monocytes: 5 %
Neutrophils Absolute: 5.4 10*3/uL (ref 1.4–7.0)
Neutrophils: 58 %
Platelets: 434 10*3/uL (ref 150–450)
RBC: 4.41 x10E6/uL (ref 3.77–5.28)
RDW: 13 % (ref 11.7–15.4)
WBC: 9.3 10*3/uL (ref 3.4–10.8)

## 2020-09-02 LAB — LIPID PANEL WITH LDL/HDL RATIO
Cholesterol, Total: 173 mg/dL (ref 100–199)
HDL: 65 mg/dL
LDL Chol Calc (NIH): 88 mg/dL (ref 0–99)
LDL/HDL Ratio: 1.4 ratio (ref 0.0–3.2)
Triglycerides: 112 mg/dL (ref 0–149)
VLDL Cholesterol Cal: 20 mg/dL (ref 5–40)

## 2020-09-02 LAB — COMPREHENSIVE METABOLIC PANEL
ALT: 18 IU/L (ref 0–32)
AST: 19 IU/L (ref 0–40)
Albumin/Globulin Ratio: 2 (ref 1.2–2.2)
Albumin: 4.3 g/dL (ref 3.8–4.8)
Alkaline Phosphatase: 92 IU/L (ref 44–121)
BUN/Creatinine Ratio: 17 (ref 9–23)
BUN: 16 mg/dL (ref 6–24)
Bilirubin Total: 0.4 mg/dL (ref 0.0–1.2)
CO2: 25 mmol/L (ref 20–29)
Calcium: 9.1 mg/dL (ref 8.7–10.2)
Chloride: 103 mmol/L (ref 96–106)
Creatinine, Ser: 0.95 mg/dL (ref 0.57–1.00)
GFR calc Af Amer: 81 mL/min/{1.73_m2} (ref >59)
GFR calc non Af Amer: 71 mL/min/{1.73_m2} (ref >59)
Globulin, Total: 2.1 g/dL (ref 1.5–4.5)
Glucose: 94 mg/dL (ref 65–99)
Potassium: 4.3 mmol/L (ref 3.5–5.2)
Sodium: 140 mmol/L (ref 134–144)
Total Protein: 6.4 g/dL (ref 6.0–8.5)

## 2020-09-02 LAB — TSH: TSH: 2.67 u[IU]/mL (ref 0.450–4.500)

## 2020-09-02 LAB — HEMOGLOBIN A1C
Est. average glucose Bld gHb Est-mCnc: 94 mg/dL
Hgb A1c MFr Bld: 4.9 % (ref 4.8–5.6)

## 2020-09-02 LAB — T4, FREE: Free T4: 1.22 ng/dL (ref 0.82–1.77)

## 2020-09-02 LAB — T3: T3, Total: 149 ng/dL (ref 71–180)

## 2020-09-02 LAB — VITAMIN D 25 HYDROXY (VIT D DEFICIENCY, FRACTURES): Vit D, 25-Hydroxy: 47 ng/mL (ref 30.0–100.0)

## 2020-09-02 LAB — INSULIN, RANDOM: INSULIN: 11 u[IU]/mL (ref 2.6–24.9)

## 2020-09-02 LAB — VITAMIN B12: Vitamin B-12: 587 pg/mL (ref 232–1245)

## 2020-09-02 NOTE — Progress Notes (Signed)
Dear Dr. Shelia Media,   Thank you for referring Allison Terrell to our clinic. The following note includes my evaluation and treatment recommendations.  Chief Complaint:   Allison Terrell (MR# [id number]) is a 50 y.o. female who presents for evaluation and treatment of obesity and related comorbidities. Current BMI is Body mass index is 42.98 kg/m. [name] has been struggling with her weight for many years and has been unsuccessful in either losing weight, maintaining weight loss, or reaching her healthy weight goal.  [name] is currently in the action stage of change and ready to dedicate time achieving and maintaining a healthier weight. [name] is interested in becoming our patient and working on intensive lifestyle modifications including (but not limited to) diet and exercise for weight loss.  [name] does like to [place], but does not like to follow recipes and notes time as an obstacle.  [name]'s habits were reviewed today and are as follows: Her family eats meals together, she thinks her family will eat healthier with her, she struggles with family and or coworkers weight loss sabotage, her desired weight loss is 95 pounds, she started gaining weight during pregnancy, her heaviest weight ever was 235 pounds, she craves chocolate and meat, she skips breakfast sometimes, she is frequently drinking liquids with calories, she frequently makes poor food choices, she has problems with excessive hunger, she frequently eats larger portions than normal and she struggles with emotional eating.  Depression Screen [name]'s Food and Mood (modified PHQ-9) score was 12.  Depression screen PHQ 2/9 09/01/2020  Decreased Interest 1  Down, Depressed, Hopeless 2  PHQ - 2 Score 3  Altered sleeping 2  Tired, decreased energy 1  Change in appetite 1  Feeling bad or failure about yourself  1  Trouble concentrating 2  Moving slowly or fidgety/restless 1  Suicidal thoughts 1  PHQ-9 Score 12  Difficult  doing work/chores Somewhat difficult   Subjective:   1. Other fatigue [name] admits to daytime somnolence and reports waking up still tired. Patent has a history of symptoms of daytime fatigue, morning fatigue and snoring. Allison Terrell generally gets 6 or 7 hours of sleep per night, and states that she has generally restful sleep. Snoring is present. Apneic episodes are present. Epworth Sleepiness Score is 11.  2. SOB (shortness of breath) on exertion [name] notes increasing shortness of breath with exercising and seems to be worsening over time with weight gain. She notes getting out of breath sooner with activity than she used to. This has gotten worse recently. [name] denies shortness of breath at rest or orthopnea.  3. OSA on CPAP [name] has a diagnosis of sleep apnea. She reports that she is using a CPAP regularly.   4. Chronic low back pain, unspecified back pain laterality, unspecified whether sciatica present She is not on any medication for this.  She has no pain with walking, but pain is exaggerated with walking.  5. History of renal calculi One renal stone in 1999.  6. Chronic kidney disease, unspecified CKD stage Stable.  Lab Results  Component Value Date   CREATININE 0.95 09/01/2020   Lab Results  Component Value Date   CREATININE 0.95 09/01/2020   BUN 16 09/01/2020   NA 140 09/01/2020   K 4.3 09/01/2020   CL 103 09/01/2020   CO2 25 09/01/2020   7. Other migraine without status migrainosus, not intractable [name] is not being followed by a neurologist.  She takes aspirin.  She has scotoma before  headaches.   8. Elevated glucose Positive family history of diabetes in her mother.  9. Depression screening Allison Terrell was screened for depression as part of her new patient workup today.  PHQ-9 is 12,  10. At risk for activity intolerance [name] is at risk for activity intolerance due to obesity, fatigue, and shortness of breath.  Assessment/Plan:   1. Other  fatigue Allison Terrell does feel that her weight is causing her energy to be lower than it should be. Fatigue may be related to obesity, depression or many other causes. Labs will be ordered, and in the meanwhile, [name] will focus on self care including making healthy food choices, increasing physical activity and focusing on stress reduction.  - EKG 12-Lead - Vitamin B12 - CBC with Differential/Platelet - Comprehensive metabolic panel - Hemoglobin A1c - Insulin, random - Lipid Panel With LDL/HDL Ratio - T4, free - T3 - TSH - VITAMIN D 25 Hydroxy (Vit-D Deficiency, Fractures)  2. SOB (shortness of breath) on exertion Allison Terrell does feel that she gets out of breath more easily that she used to when she exercises. Allison Terrell's shortness of breath appears to be obesity related and exercise induced. She has agreed to work on weight loss and gradually increase exercise to treat her exercise induced shortness of breath. Will continue to monitor closely.  - Lipid Panel With LDL/HDL Ratio  3. OSA on CPAP Intensive lifestyle modifications are the first line treatment for this issue. We discussed several lifestyle modifications today and she will continue to work on diet, exercise and weight loss efforts. We will continue to monitor. Orders and follow up as documented in patient record.   4. Chronic low back pain, unspecified back pain laterality, unspecified whether sciatica present Will consider stretching.  No pounding exercises.  5. History of renal calculi She will increase her water intake.  6. Chronic kidney disease, unspecified CKD stage Lab results and trends reviewed. We discussed several lifestyle modifications today and she will continue to work on diet, exercise and weight loss efforts. Avoid nephrotoxic medications. Will follow over time.   Counseling . Chronic kidney disease (CKD) happens when the kidneys are damaged over a long period of time. . Most of the time, this condition does not go  away, but it can usually be controlled. Steps must be taken to slow down the kidney damage or to stop it from getting worse. . Intensive lifestyle modifications are the first line treatment for this issue.  [name] Avoid buying foods that are: processed, frozen, or prepackaged to avoid excess salt.  7. Other migraine without status migrainosus, not intractable Follow-up with PCP.  8. Elevated glucose Will check A1c, insulin level , and CMP today.  - Hemoglobin A1c - Insulin, random - Comprehensive metabolic panel  9. Depression screening Makaylen had a positive depression screening. Depression is commonly associated with obesity and often results in emotional eating behaviors. We will monitor this closely and work on CBT to help improve the non-hunger eating patterns. Referral to Psychology may be required if no improvement is seen as she continues in our clinic.  10. At risk for activity intolerance Sharmin was given approximately 15 minutes of exercise intolerance counseling today. She is 50 y.o. female and has risk factors exercise intolerance including obesity. We discussed intensive lifestyle modifications today with an emphasis on specific weight loss instructions and strategies. Avenly will slowly increase activity as tolerated.  Repetitive spaced learning was employed today to elicit superior memory formation and behavioral change.  11. Class  3 severe obesity with serious comorbidity and body mass index (BMI) of 40.0 to 44.9 in adult, unspecified obesity type (HCC)  Nyasha is currently in the action stage of change and her goal is to continue with weight loss efforts. I recommend Abrianna begin the structured treatment plan as follows:  She has agreed to the Category 3 Plan.  She will work on meal planning and not skipping meals.  Labs from 06/02/2020 were reviewed, including CMP, lipid panel, TSH, and vitamin D.  Exercise goals: No exercise has been prescribed at this time.   Behavioral  modification strategies: increasing lean protein intake, decreasing simple carbohydrates, increasing vegetables, increasing water intake, decreasing eating out, no skipping meals, meal planning and cooking strategies, keeping healthy foods in the home and planning for success.  She was informed of the importance of frequent follow-up visits to maximize her success with intensive lifestyle modifications for her multiple health conditions. She was informed we would discuss her lab results at her next visit unless there is a critical issue that needs to be addressed sooner. Torah agreed to keep her next visit at the agreed upon time to discuss these results.  Objective:   Blood pressure 118/76, pulse 79, temperature 98.1 F (36.7 C), temperature source Oral, height 5\' 2"  (1.575 m), weight 235 lb (106.6 kg), SpO2 97 %. Body mass index is 42.98 kg/m.  EKG: Normal sinus rhythm, rate 75 bpm.  Indirect Calorimeter completed today shows a VO2 of 302 and a REE of 2103.  Her calculated basal metabolic rate is [id number] thus her basal metabolic rate is better than expected.  General: Cooperative, alert, well developed, in no acute distress. HEENT: Conjunctivae and lids unremarkable. Cardiovascular: Regular rhythm.  Lungs: Normal work of breathing. Neurologic: No focal deficits.   Lab Results  Component Value Date   CREATININE 0.95 09/01/2020   BUN 16 09/01/2020   NA 140 09/01/2020   K 4.3 09/01/2020   CL 103 09/01/2020   CO2 25 09/01/2020   Lab Results  Component Value Date   ALT 18 09/01/2020   AST 19 09/01/2020   ALKPHOS 92 09/01/2020   BILITOT 0.4 09/01/2020   Lab Results  Component Value Date   HGBA1C 4.9 09/01/2020   Lab Results  Component Value Date   INSULIN 11.0 09/01/2020   Lab Results  Component Value Date   TSH 2.670 09/01/2020   Lab Results  Component Value Date   CHOL 173 09/01/2020   HDL 65 09/01/2020   LDLCALC 88 09/01/2020   TRIG 112 09/01/2020   Lab Results   Component Value Date   WBC 9.3 09/01/2020   HGB 12.9 09/01/2020   HCT 40.0 09/01/2020   MCV 91 09/01/2020   PLT 434 09/01/2020   Attestation Statements:   Reviewed by clinician on day of visit: allergies, medications, problem list, medical history, surgical history, family history, social history, and previous encounter notes.  I, Water quality scientist, CMA, am acting as Location manager for [employer], DO  I have reviewed the above documentation for accuracy and completeness, and I agree with the above. Jearld Lesch, DO

## 2020-09-15 ENCOUNTER — Ambulatory Visit (INDEPENDENT_AMBULATORY_CARE_PROVIDER_SITE_OTHER): Payer: Managed Care, Other (non HMO) | Admitting: Bariatrics

## 2020-09-15 ENCOUNTER — Encounter (INDEPENDENT_AMBULATORY_CARE_PROVIDER_SITE_OTHER): Payer: Self-pay | Admitting: Bariatrics

## 2020-09-15 ENCOUNTER — Other Ambulatory Visit: Payer: Self-pay

## 2020-09-15 VITALS — BP 116/78 | HR 77 | Temp 97.7°F | Ht 62.0 in | Wt 233.0 lb

## 2020-09-15 DIAGNOSIS — E8881 Metabolic syndrome: Secondary | ICD-10-CM | POA: Diagnosis not present

## 2020-09-15 DIAGNOSIS — E559 Vitamin D deficiency, unspecified: Secondary | ICD-10-CM | POA: Diagnosis not present

## 2020-09-15 DIAGNOSIS — Z9189 Other specified personal risk factors, not elsewhere classified: Secondary | ICD-10-CM

## 2020-09-15 DIAGNOSIS — G43809 Other migraine, not intractable, without status migrainosus: Secondary | ICD-10-CM

## 2020-09-15 DIAGNOSIS — Z6841 Body Mass Index (BMI) 40.0 and over, adult: Secondary | ICD-10-CM

## 2020-09-16 NOTE — Progress Notes (Signed)
Chief Complaint:   OBESITY Allison Terrell is here to discuss her progress with her obesity treatment plan along with follow-up of her obesity related diagnoses. Allison Terrell is on the Category 3 Plan and states she is following her eating plan approximately 50% of the time. Allison Terrell states she is riding the stationary bike for 30 minutes 3-4 times per week.  Today's visit was #: 2 Starting weight: 235 lbs Starting date: 09/01/2020 Today's weight: 233 lbs Today's date: 09/15/2020 Total lbs lost to date: 2 Total lbs lost since last in-office visit: 2  Interim History: Allison Terrell is down 2 lbs since her last visit. She states that she has had struggles with getting evening protein in.  Subjective:   1. Insulin resistance Allison Terrell's last A1c was 4.9 and insulin level at 11.0.  2. Other migraine without status migrainosus, not intractable Allison Terrell is taking OTC medications if needed.  3. Vitamin D deficiency Allison Terrell's Vit D level is well controlled at 47.0. She is on OTC Vit D.  4. At risk for osteoporosis Allison Terrell is at higher risk of osteopenia and osteoporosis due to Vitamin D deficiency.   Assessment/Plan:   1. Insulin resistance Allison Terrell will continue to work on weight loss, exercise, and decreasing simple carbohydrates to help decrease the risk of diabetes. She is to increase healthy fats, and be as active as possible. Insulin resistance and pre-diabetes handouts were provided. Allison Terrell agreed to follow-up with [name] as directed to closely monitor her progress.  2. Other migraine without status migrainosus, not intractable Allison Terrell is aware that migraines may improve with weight loss.  3. Vitamin D deficiency Low Vitamin D level contributes to fatigue and are associated with obesity, breast, and colon cancer. Allison Terrell will continue OTC Vitamin D 1,000-2,000 IU daily. She will follow-up for routine testing of Vitamin D, at least 2-3 times per year to avoid over-replacement.  4. At risk for  osteoporosis Allison Terrell was given approximately 15 minutes of osteoporosis prevention counseling today. Allison Terrell is at risk for osteopenia and osteoporosis due to her Vitamin D deficiency. She was encouraged to take her Vitamin D and follow her higher calcium diet and increase strengthening exercise to help strengthen her bones and decrease her risk of osteopenia and osteoporosis.  Repetitive spaced learning was employed today to elicit superior memory formation and behavioral change.  5. Class 3 severe obesity with serious comorbidity and body mass index (BMI) of 40.0 to 44.9 in adult, unspecified obesity type (HCC) Allison Terrell is currently in the action stage of change. As such, her goal is to continue with weight loss efforts. She has agreed to the Category 3 Plan.   Intentional eating. I reviewed labs with the patient today from 09/01/2020. Protein Equivalent sheet provided.  Exercise goals: As is.  Behavioral modification strategies: increasing lean protein intake, decreasing simple carbohydrates, increasing vegetables, increasing water intake, decreasing eating out, no skipping meals, meal planning and cooking strategies, keeping healthy foods in the home and planning for success.  Allison Terrell has agreed to follow-up with our clinic in 2 weeks. She was informed of the importance of frequent follow-up visits to maximize her success with intensive lifestyle modifications for her multiple health conditions.   Objective:   Blood pressure 116/78, pulse 77, temperature 97.7 F (36.5 C), height 5\' 2"  (1.575 m), weight 233 lb (105.7 kg), SpO2 95 %. Body mass index is 42.62 kg/m.  General: Cooperative, alert, well developed, in no acute distress. HEENT: Conjunctivae and lids unremarkable. Cardiovascular: Regular rhythm.  Lungs: Normal  work of breathing. Neurologic: No focal deficits.   Lab Results  Component Value Date   CREATININE 0.95 09/01/2020   BUN 16 09/01/2020   NA 140 09/01/2020   K 4.3  09/01/2020   CL 103 09/01/2020   CO2 25 09/01/2020   Lab Results  Component Value Date   ALT 18 09/01/2020   AST 19 09/01/2020   ALKPHOS 92 09/01/2020   BILITOT 0.4 09/01/2020   Lab Results  Component Value Date   HGBA1C 4.9 09/01/2020   Lab Results  Component Value Date   INSULIN 11.0 09/01/2020   Lab Results  Component Value Date   TSH 2.670 09/01/2020   Lab Results  Component Value Date   CHOL 173 09/01/2020   HDL 65 09/01/2020   LDLCALC 88 09/01/2020   TRIG 112 09/01/2020   Lab Results  Component Value Date   WBC 9.3 09/01/2020   HGB 12.9 09/01/2020   HCT 40.0 09/01/2020   MCV 91 09/01/2020   PLT 434 09/01/2020   No results found for: IRON, TIBC, FERRITIN  Attestation Statements:   Reviewed by clinician on day of visit: allergies, medications, problem list, medical history, surgical history, family history, social history, and previous encounter notes.   Wilhemena Durie, am acting as Location manager for [employer], DO.  I have reviewed the above documentation for accuracy and completeness, and I agree with the above. Jearld Lesch, DO

## 2020-09-18 ENCOUNTER — Encounter (INDEPENDENT_AMBULATORY_CARE_PROVIDER_SITE_OTHER): Payer: Self-pay | Admitting: Bariatrics

## 2020-09-29 ENCOUNTER — Ambulatory Visit (INDEPENDENT_AMBULATORY_CARE_PROVIDER_SITE_OTHER): Payer: Managed Care, Other (non HMO) | Admitting: Bariatrics

## 2020-09-29 ENCOUNTER — Other Ambulatory Visit: Payer: Self-pay

## 2020-09-29 ENCOUNTER — Encounter (INDEPENDENT_AMBULATORY_CARE_PROVIDER_SITE_OTHER): Payer: Self-pay | Admitting: Bariatrics

## 2020-09-29 VITALS — BP 128/80 | HR 79 | Temp 97.6°F | Ht 62.0 in | Wt 232.0 lb

## 2020-09-29 DIAGNOSIS — G4733 Obstructive sleep apnea (adult) (pediatric): Secondary | ICD-10-CM

## 2020-09-29 DIAGNOSIS — E662 Morbid (severe) obesity with alveolar hypoventilation: Secondary | ICD-10-CM

## 2020-09-29 DIAGNOSIS — M545 Low back pain, unspecified: Secondary | ICD-10-CM

## 2020-09-29 DIAGNOSIS — G8929 Other chronic pain: Secondary | ICD-10-CM

## 2020-09-29 DIAGNOSIS — Z6841 Body Mass Index (BMI) 40.0 and over, adult: Secondary | ICD-10-CM

## 2020-09-30 NOTE — Progress Notes (Unsigned)
Chief Complaint:   OBESITY Allison Terrell is here to discuss her progress with her obesity treatment plan along with follow-up of her obesity related diagnoses. [name] is on the Category 3 Plan and states she is following her eating plan approximately 60% of the time. [name] states she is riding the stationary bike and walking for 30 minutes 3-4 times per week.  Today's visit was #: 3 Starting weight: 235 lbs Starting date: 09/01/2020 Today's weight: 232 lbs Today's date: 09/29/2020 Total lbs lost to date: 3 Total lbs lost since last in-office visit: 1  Interim History: Allison Terrell is down 1 lb since her last visit. She struggles with vegetables.  Subjective:   1. Obstructive sleep apnea syndrome Allison Terrell is using CPAP, and she is sleeping ok.  2. Chronic bilateral low back pain without sciatica [name]'s back pain has improved with stretching.  Assessment/Plan:   1. Obstructive sleep apnea syndrome Intensive lifestyle modifications are the first line treatment for this issue. We discussed several lifestyle modifications today. Allison Terrell will continue CPAP, and will continue to work on diet, exercise and weight loss efforts. We will continue to monitor. Orders and follow up as documented in patient record.   2. Chronic bilateral low back pain without sciatica [name] is not to do pounding exercise, and will do stretching exercise.  3. Class 3 obesity with alveolar hypoventilation, serious comorbidity, and body mass index (BMI) of 40.0 to 44.9 in adult [facility] [name] is currently in the action stage of change. As such, her goal is to continue with weight loss efforts. She has agreed to the Category 3 Plan.   Allison Terrell will adhere to the plan closely at least 80% of the time. She will increase vegetables, and water.  Exercise goals: As is.  Behavioral modification strategies: increasing lean protein intake, decreasing simple carbohydrates, increasing vegetables, increasing water intake, decreasing  eating out, no skipping meals, meal planning and cooking strategies, keeping healthy foods in the home and planning for success.  Allison Terrell has agreed to follow-up with our clinic in 2 weeks. She was informed of the importance of frequent follow-up visits to maximize her success with intensive lifestyle modifications for her multiple health conditions.   Objective:   Blood pressure 128/80, pulse 79, temperature 97.6 F (36.4 C), height 5\' 2"  (1.575 m), weight 232 lb (105.2 kg), SpO2 96 %. Body mass index is 42.43 kg/m.  General: Cooperative, alert, well developed, in no acute distress. HEENT: Conjunctivae and lids unremarkable. Cardiovascular: Regular rhythm.  Lungs: Normal work of breathing. Neurologic: No focal deficits.   Lab Results  Component Value Date   CREATININE 0.95 09/01/2020   BUN 16 09/01/2020   NA 140 09/01/2020   K 4.3 09/01/2020   CL 103 09/01/2020   CO2 25 09/01/2020   Lab Results  Component Value Date   ALT 18 09/01/2020   AST 19 09/01/2020   ALKPHOS 92 09/01/2020   BILITOT 0.4 09/01/2020   Lab Results  Component Value Date   HGBA1C 4.9 09/01/2020   Lab Results  Component Value Date   INSULIN 11.0 09/01/2020   Lab Results  Component Value Date   TSH 2.670 09/01/2020   Lab Results  Component Value Date   CHOL 173 09/01/2020   HDL 65 09/01/2020   LDLCALC 88 09/01/2020   TRIG 112 09/01/2020   Lab Results  Component Value Date   WBC 9.3 09/01/2020   HGB 12.9 09/01/2020   HCT 40.0 09/01/2020   MCV 91 09/01/2020  PLT 434 09/01/2020   No results found for: IRON, TIBC, FERRITIN  Attestation Statements:   Reviewed by clinician on day of visit: allergies, medications, problem list, medical history, surgical history, family history, social history, and previous encounter notes.  Time spent on visit including pre-visit chart review and post-visit care and charting was 20 minutes.    Wilhemena Durie, am acting as Location manager for [employer], DO.  I have reviewed the above documentation for accuracy and completeness, and I agree with the above. Jearld Lesch, DO

## 2020-10-01 ENCOUNTER — Encounter (INDEPENDENT_AMBULATORY_CARE_PROVIDER_SITE_OTHER): Payer: Self-pay | Admitting: Bariatrics

## 2020-10-16 ENCOUNTER — Other Ambulatory Visit: Payer: Self-pay

## 2020-10-16 ENCOUNTER — Ambulatory Visit (INDEPENDENT_AMBULATORY_CARE_PROVIDER_SITE_OTHER): Payer: Managed Care, Other (non HMO) | Admitting: Bariatrics

## 2020-10-16 ENCOUNTER — Encounter (INDEPENDENT_AMBULATORY_CARE_PROVIDER_SITE_OTHER): Payer: Self-pay | Admitting: Bariatrics

## 2020-10-16 VITALS — BP 124/81 | HR 74 | Temp 98.5°F | Ht 62.0 in | Wt 232.0 lb

## 2020-10-16 DIAGNOSIS — E8881 Metabolic syndrome: Secondary | ICD-10-CM

## 2020-10-16 DIAGNOSIS — E559 Vitamin D deficiency, unspecified: Secondary | ICD-10-CM

## 2020-10-16 DIAGNOSIS — Z6841 Body Mass Index (BMI) 40.0 and over, adult: Secondary | ICD-10-CM | POA: Diagnosis not present

## 2020-10-21 ENCOUNTER — Encounter (INDEPENDENT_AMBULATORY_CARE_PROVIDER_SITE_OTHER): Payer: Self-pay | Admitting: Bariatrics

## 2020-10-21 NOTE — Progress Notes (Signed)
Chief Complaint:   OBESITY Allison Terrell is here to discuss her progress with her obesity treatment plan along with follow-up of her obesity related diagnoses. Allison Terrell is on the Category 3 Plan and states she is following her eating plan approximately 60% of the time. Allison Terrell states she is walking for 30 minutes 3-4 times per week.  Today's visit was #: 4 Starting weight: 235 lbs Starting date: 09/01/2020 Today's weight: 232 lbs Today's date: 10/16/2020 Total lbs lost to date: 3 lbs Total lbs lost since last in-office visit: 0  Interim History: Allison Terrell's weight remains the same since her last visit.  She had to go out to eat.  She is doing better with water.  Subjective:   1. Insulin resistance Allison Terrell has a diagnosis of insulin resistance based on her elevated fasting insulin level >5. She continues to work on diet and exercise to decrease her risk of diabetes.  She is on no medications for this.  Lab Results  Component Value Date   INSULIN 11.0 09/01/2020   Lab Results  Component Value Date   HGBA1C 4.9 09/01/2020   2. Vitamin D deficiency Allison Terrell's Vitamin D level was 47.0 on 09/01/2020. She is currently taking a multivitamin. She denies nausea, vomiting or muscle weakness.  Assessment/Plan:   1. Insulin resistance Allison Terrell will continue to work on weight loss, exercise, and decreasing simple carbohydrates to help decrease the risk of diabetes. Allison Terrell agreed to follow-up with [name] as directed to closely monitor her progress.  Continue to decrease carbohydrates and increase healthy fats and protein.  2. Vitamin D deficiency Low Vitamin D level contributes to fatigue and are associated with obesity, breast, and colon cancer. She agrees to continue taking her daily multivitamin.  3. Class 3 severe obesity with serious comorbidity and body mass index (BMI) of 40.0 to 44.9 in adult, unspecified obesity type (HCC)  Allison Terrell is currently in the action stage of change. As such, her goal is to  continue with weight loss efforts. She has agreed to the Category 3 Plan.   She will work on increasing adherence to the plan and will minimize sweets.  Exercise goals: As is.  Behavioral modification strategies: increasing lean protein intake, decreasing simple carbohydrates, increasing vegetables, increasing water intake, decreasing eating out, no skipping meals, meal planning and cooking strategies, keeping healthy foods in the home and planning for success.  Allison Terrell has agreed to follow-up with our clinic in 2 weeks. She was informed of the importance of frequent follow-up visits to maximize her success with intensive lifestyle modifications for her multiple health conditions.   Objective:   Blood pressure 124/81, pulse 74, temperature 98.5 F (36.9 C), height 5\' 2"  (1.575 m), weight 232 lb (105.2 kg), SpO2 98 %. Body mass index is 42.43 kg/m.  General: Cooperative, alert, well developed, in no acute distress. HEENT: Conjunctivae and lids unremarkable. Cardiovascular: Regular rhythm.  Lungs: Normal work of breathing. Neurologic: No focal deficits.   Lab Results  Component Value Date   CREATININE 0.95 09/01/2020   BUN 16 09/01/2020   NA 140 09/01/2020   K 4.3 09/01/2020   CL 103 09/01/2020   CO2 25 09/01/2020   Lab Results  Component Value Date   ALT 18 09/01/2020   AST 19 09/01/2020   ALKPHOS 92 09/01/2020   BILITOT 0.4 09/01/2020   Lab Results  Component Value Date   HGBA1C 4.9 09/01/2020   Lab Results  Component Value Date   INSULIN 11.0 09/01/2020  Lab Results  Component Value Date   TSH 2.670 09/01/2020   Lab Results  Component Value Date   CHOL 173 09/01/2020   HDL 65 09/01/2020   LDLCALC 88 09/01/2020   TRIG 112 09/01/2020   Lab Results  Component Value Date   WBC 9.3 09/01/2020   HGB 12.9 09/01/2020   HCT 40.0 09/01/2020   MCV 91 09/01/2020   PLT 434 09/01/2020   Attestation Statements:   Reviewed by clinician on day of visit: allergies,  medications, problem list, medical history, surgical history, family history, social history, and previous encounter notes.  Time spent on visit including pre-visit chart review and post-visit care and charting was 20 minutes.   I, Water quality scientist, CMA, am acting as Location manager for [employer], DO  I have reviewed the above documentation for accuracy and completeness, and I agree with the above. Jearld Lesch, DO

## 2020-10-29 ENCOUNTER — Ambulatory Visit (INDEPENDENT_AMBULATORY_CARE_PROVIDER_SITE_OTHER): Payer: Managed Care, Other (non HMO) | Admitting: Bariatrics

## 2020-10-29 ENCOUNTER — Other Ambulatory Visit: Payer: Self-pay

## 2020-10-29 ENCOUNTER — Encounter (INDEPENDENT_AMBULATORY_CARE_PROVIDER_SITE_OTHER): Payer: Self-pay | Admitting: Bariatrics

## 2020-10-29 VITALS — BP 134/71 | HR 79 | Temp 98.2°F | Ht 62.0 in | Wt 232.0 lb

## 2020-10-29 DIAGNOSIS — Z9189 Other specified personal risk factors, not elsewhere classified: Secondary | ICD-10-CM | POA: Diagnosis not present

## 2020-10-29 DIAGNOSIS — E8881 Metabolic syndrome: Secondary | ICD-10-CM

## 2020-10-29 DIAGNOSIS — Z6841 Body Mass Index (BMI) 40.0 and over, adult: Secondary | ICD-10-CM | POA: Diagnosis not present

## 2020-10-29 DIAGNOSIS — E559 Vitamin D deficiency, unspecified: Secondary | ICD-10-CM | POA: Diagnosis not present

## 2020-10-29 MED ORDER — METFORMIN HCL 500 MG PO TABS
500.0000 mg | ORAL_TABLET | Freq: Every day | ORAL | 0 refills | Status: DC
Start: 2020-10-29 — End: 2020-11-27

## 2020-11-04 ENCOUNTER — Encounter (INDEPENDENT_AMBULATORY_CARE_PROVIDER_SITE_OTHER): Payer: Self-pay | Admitting: Bariatrics

## 2020-11-04 NOTE — Progress Notes (Signed)
Chief Complaint:   OBESITY Allison Terrell is here to discuss her progress with her obesity treatment plan along with follow-up of her obesity related diagnoses. Allison Terrell is on the Category 3 Plan and states she is following her eating plan approximately 70% of the time. Allison Terrell states she is using the stationary bike for 30 minutes 3-4 times per week.  Today's visit was #: 5 Starting weight: 235 lbs Starting date: 09/01/2020 Today's weight: 232 lbs Today's date: 10/29/2020 Total lbs lost to date: 3 lbs Total lbs lost since last in-office visit: 0  Interim History: Allison Terrell's weight remains the same since her last visit.  She is doing better with drinking adequate water.  She denies skipping meals. Her water weight is up 1.5 pounds on the bioimpedance scale.  Subjective:   1. Vitamin D deficiency Allison Terrell's Vitamin D level was 47.0 on 09/01/2020. She is currently taking a multivitamin. She denies nausea, vomiting or muscle weakness.  2. Insulin resistance Allison Terrell has a diagnosis of insulin resistance based on her elevated fasting insulin level >5. She continues to work on diet and exercise to decrease her risk of diabetes.  She is on no medications for this.  Lab Results  Component Value Date   INSULIN 11.0 09/01/2020   Lab Results  Component Value Date   HGBA1C 4.9 09/01/2020   3. At risk for nausea Allison Terrell is at risk for nausea due to starting metformin.  Assessment/Plan:   1. Vitamin D deficiency Low Vitamin D level contributes to fatigue and are associated with obesity, breast, and colon cancer. She agrees to continue her multivitamin and increase her sun exposure for short periods of time.  2. Insulin resistance Allison Terrell will continue to work on weight loss, exercise, and decreasing simple carbohydrates to help decrease the risk of diabetes. Allison Terrell agreed to follow-up with [name] as directed to closely monitor her progress.  She will decrease .carbs and will continue the stationary bike.   Will start metformin, as per below.  - Start metFORMIN (GLUCOPHAGE) 500 MG tablet; Take 1 tablet (500 mg total) by mouth daily with lunch.  Dispense: 30 tablet; Refill: 0  3. At risk for nausea Allison Terrell was given approximately 15 minutes of nausea prevention counseling today. Allison Terrell is at risk for nausea due to her new or current medication. She was encouraged to titrate her medication slowly, make sure to stay hydrated, eat smaller portions throughout the day, and avoid high fat meals.   4. Class 3 severe obesity with serious comorbidity and body mass index (BMI) of 40.0 to 44.9 in adult, unspecified obesity type (HCC)  Allison Terrell is currently in the action stage of change. As such, her goal is to continue with weight loss efforts. She has agreed to the Category 3 Plan.   She will continue to weigh her meat and decrease portion sizes and increase her water intake.  Exercise goals: As is.  Behavioral modification strategies: increasing lean protein intake, decreasing simple carbohydrates, increasing vegetables, decreasing eating out, no skipping meals, meal planning and cooking strategies, keeping healthy foods in the home and planning for success.  Allison Terrell has agreed to follow-up with our clinic in 2 weeks. She was informed of the importance of frequent follow-up visits to maximize her success with intensive lifestyle modifications for her multiple health conditions.   Objective:   Blood pressure 134/71, pulse 79, temperature 98.2 F (36.8 C), height 5\' 2"  (1.575 m), weight 232 lb (105.2 kg), SpO2 97 %. Body mass  index is 42.43 kg/m.  General: Cooperative, alert, well developed, in no acute distress. HEENT: Conjunctivae and lids unremarkable. Cardiovascular: Regular rhythm.  Lungs: Normal work of breathing. Neurologic: No focal deficits.   Lab Results  Component Value Date   CREATININE 0.95 09/01/2020   BUN 16 09/01/2020   NA 140 09/01/2020   K 4.3 09/01/2020   CL 103  09/01/2020   CO2 25 09/01/2020   Lab Results  Component Value Date   ALT 18 09/01/2020   AST 19 09/01/2020   ALKPHOS 92 09/01/2020   BILITOT 0.4 09/01/2020   Lab Results  Component Value Date   HGBA1C 4.9 09/01/2020   Lab Results  Component Value Date   INSULIN 11.0 09/01/2020   Lab Results  Component Value Date   TSH 2.670 09/01/2020   Lab Results  Component Value Date   CHOL 173 09/01/2020   HDL 65 09/01/2020   LDLCALC 88 09/01/2020   TRIG 112 09/01/2020   Lab Results  Component Value Date   WBC 9.3 09/01/2020   HGB 12.9 09/01/2020   HCT 40.0 09/01/2020   MCV 91 09/01/2020   PLT 434 09/01/2020   Attestation Statements:   Reviewed by clinician on day of visit: allergies, medications, problem list, medical history, surgical history, family history, social history, and previous encounter notes.  Time spent on visit including pre-visit chart review and post-visit care and charting was 30 minutes.   I have reviewed the above documentation for accuracy and completeness, and I agree with the above. Jearld Lesch, DO

## 2020-11-10 ENCOUNTER — Ambulatory Visit (INDEPENDENT_AMBULATORY_CARE_PROVIDER_SITE_OTHER): Payer: Managed Care, Other (non HMO) | Admitting: Bariatrics

## 2020-11-10 ENCOUNTER — Encounter (INDEPENDENT_AMBULATORY_CARE_PROVIDER_SITE_OTHER): Payer: Self-pay | Admitting: Bariatrics

## 2020-11-10 ENCOUNTER — Other Ambulatory Visit: Payer: Self-pay

## 2020-11-10 VITALS — BP 123/81 | HR 65 | Temp 97.9°F | Ht 62.0 in | Wt 228.0 lb

## 2020-11-10 DIAGNOSIS — Z6841 Body Mass Index (BMI) 40.0 and over, adult: Secondary | ICD-10-CM

## 2020-11-10 DIAGNOSIS — E559 Vitamin D deficiency, unspecified: Secondary | ICD-10-CM

## 2020-11-10 DIAGNOSIS — E8881 Metabolic syndrome: Secondary | ICD-10-CM

## 2020-11-12 ENCOUNTER — Encounter (INDEPENDENT_AMBULATORY_CARE_PROVIDER_SITE_OTHER): Payer: Self-pay | Admitting: Bariatrics

## 2020-11-12 NOTE — Progress Notes (Signed)
Chief Complaint:   OBESITY Allison Terrell is here to discuss her progress with her obesity treatment plan along with follow-up of her obesity related diagnoses. Tian is on the Category 3 Plan and states she is following her eating plan approximately 75% of the time. Velva states she is not currently exercising.  Today's visit was #: 6 Starting weight: 235 lbs Starting date: 09/01/2020 Today's weight: 228 lbs Today's date: 11/10/2020 Total lbs lost to date: 7 Total lbs lost since last in-office visit: 4  Interim History: Allison Terrell is down an additional 4 lbs since her last visit. She did not do well with her water, but did ok with protein intake.   Subjective:   1. Insulin resistance Allison Terrell is taking Metformin.  Lab Results  Component Value Date   INSULIN 11.0 09/01/2020   Lab Results  Component Value Date   HGBA1C 4.9 09/01/2020    2. Vitamin D deficiency Allison Terrell is taking a multivitamin. She reports minimal sun exposure.   Assessment/Plan:   1. Insulin resistance Allison Terrell will continue to work on weight loss, exercise, and decreasing simple carbohydrates to help decrease the risk of diabetes. Allison Terrell agreed to follow-up with [name] as directed to closely monitor her progress. Continue Metformin.  2. Vitamin D deficiency Low Vitamin D level contributes to fatigue and are associated with obesity, breast, and colon cancer. She agrees to continue multivitamin daily and will follow-up for routine testing of Vitamin D, at least 2-3 times per year to avoid over-replacement.  3. Obesity, current BMI 41 Allison Terrell is currently in the action stage of change. As such, her goal is to continue with weight loss efforts. She has agreed to the Category 3 Plan.   Exercise goals: Has a stationary bike and walking  Behavioral modification strategies: increasing lean protein intake, decreasing simple carbohydrates, increasing vegetables, increasing water intake, decreasing eating out, no skipping meals,  meal planning and cooking strategies, keeping healthy foods in the home and planning for success.  Allison Terrell has agreed to follow-up with our clinic in 2 weeks. She was informed of the importance of frequent follow-up visits to maximize her success with intensive lifestyle modifications for her multiple health conditions.   Objective:   Blood pressure 123/81, pulse 65, temperature 97.9 F (36.6 C), height 5\' 2"  (1.575 m), weight 228 lb (103.4 kg), SpO2 96 %. Body mass index is 41.7 kg/m.  General: Cooperative, alert, well developed, in no acute distress. HEENT: Conjunctivae and lids unremarkable. Cardiovascular: Regular rhythm.  Lungs: Normal work of breathing. Neurologic: No focal deficits.   Lab Results  Component Value Date   CREATININE 0.95 09/01/2020   BUN 16 09/01/2020   NA 140 09/01/2020   K 4.3 09/01/2020   CL 103 09/01/2020   CO2 25 09/01/2020   Lab Results  Component Value Date   ALT 18 09/01/2020   AST 19 09/01/2020   ALKPHOS 92 09/01/2020   BILITOT 0.4 09/01/2020   Lab Results  Component Value Date   HGBA1C 4.9 09/01/2020   Lab Results  Component Value Date   INSULIN 11.0 09/01/2020   Lab Results  Component Value Date   TSH 2.670 09/01/2020   Lab Results  Component Value Date   CHOL 173 09/01/2020   HDL 65 09/01/2020   LDLCALC 88 09/01/2020   TRIG 112 09/01/2020   Lab Results  Component Value Date   WBC 9.3 09/01/2020   HGB 12.9 09/01/2020   HCT 40.0 09/01/2020   MCV 91 09/01/2020  PLT 434 09/01/2020    Attestation Statements:   Reviewed by clinician on day of visit: allergies, medications, problem list, medical history, surgical history, family history, social history, and previous encounter notes.  Time spent on visit including pre-visit chart review and post-visit care and charting was 20 minutes.   Coral Ceo, am acting as Location manager for [employer], DO.  I have reviewed the above documentation for accuracy and  completeness, and I agree with the above. Jearld Lesch, DO

## 2020-11-27 ENCOUNTER — Ambulatory Visit (INDEPENDENT_AMBULATORY_CARE_PROVIDER_SITE_OTHER): Payer: Managed Care, Other (non HMO) | Admitting: Bariatrics

## 2020-11-27 ENCOUNTER — Encounter (INDEPENDENT_AMBULATORY_CARE_PROVIDER_SITE_OTHER): Payer: Self-pay | Admitting: Bariatrics

## 2020-11-27 ENCOUNTER — Other Ambulatory Visit: Payer: Self-pay

## 2020-11-27 VITALS — BP 119/78 | HR 64 | Temp 97.4°F | Ht 62.0 in | Wt 224.0 lb

## 2020-11-27 DIAGNOSIS — M545 Low back pain, unspecified: Secondary | ICD-10-CM

## 2020-11-27 DIAGNOSIS — Z6841 Body Mass Index (BMI) 40.0 and over, adult: Secondary | ICD-10-CM

## 2020-11-27 DIAGNOSIS — G8929 Other chronic pain: Secondary | ICD-10-CM

## 2020-11-27 DIAGNOSIS — Z9189 Other specified personal risk factors, not elsewhere classified: Secondary | ICD-10-CM

## 2020-11-27 DIAGNOSIS — E8881 Metabolic syndrome: Secondary | ICD-10-CM

## 2020-11-27 MED ORDER — METFORMIN HCL 500 MG PO TABS: 500.0000 mg | ORAL_TABLET | Freq: Every day | ORAL | 0 refills | Status: DC

## 2020-12-01 NOTE — Progress Notes (Signed)
Chief Complaint:   OBESITY Allison Terrell is here to discuss her progress with her obesity treatment plan along with follow-up of her obesity related diagnoses. Allison Terrell is on the Category 3 Plan and states she is following her eating plan approximately 70% of the time. Allison Terrell states she is walking for 30 minutes 2-3 times per week.  Today's visit was #: 7 Starting weight: 235 lbs Starting date: 09/01/2020 Today's weight: 224 lbs Today's date: 11/27/2020 Total lbs lost to date: 11 lbs Total lbs lost since last in-office visit: 4 lbs  Interim History: Allison Terrell is down 4 pounds since her last visit.  She has not done well with her water.  Subjective:   1. Chronic bilateral low back pain without sciatica Taking Tylenol.  2. Insulin resistance Allison Terrell has a diagnosis of insulin resistance based on her elevated fasting insulin level >5. She continues to work on diet and exercise to decrease her risk of diabetes.  She endorses less polyphagia.  Lab Results  Component Value Date   INSULIN 11.0 09/01/2020   Lab Results  Component Value Date   HGBA1C 4.9 09/01/2020   3. At risk of diabetes mellitus Allison Terrell is at higher than average risk for developing diabetes due to obesity.   Assessment/Plan:   1. Chronic bilateral low back pain without sciatica No pounding exercises.  2. Insulin resistance Allison Terrell will continue to work on weight loss, exercise, and decreasing simple carbohydrates to help decrease the risk of diabetes. Allison Terrell agreed to follow-up with Allison Terrell as directed to closely monitor her progress.  Will refill metformin today, as per below.  - Refill metFORMIN (GLUCOPHAGE) 500 MG tablet; Take 1 tablet (500 mg total) by mouth daily with lunch.  Dispense: 30 tablet; Refill: 0  3. At risk of diabetes mellitus Allison Terrell was given approximately 15 minutes of diabetes education and counseling today. We discussed intensive lifestyle modifications today with an emphasis on weight loss as well as  increasing exercise and decreasing simple carbohydrates in her diet. We also reviewed medication options with an emphasis on risk versus benefit of those discussed.   Repetitive spaced learning was employed today to elicit superior memory formation and behavioral change.  4. Class 3 severe obesity with serious comorbidity and body mass index (BMI) of 40.0 to 44.9 in adult, unspecified obesity type (HCC)  Allison Terrell is currently in the action stage of change. As such, her goal is to continue with weight loss efforts. She has agreed to the Category 3 Plan.   She will remain adherent to the plan, will practice mindful eating, and will start drinking water earlier in the day.  Exercise goals: Continue walking and using the stationary bike.  Behavioral modification strategies: increasing lean protein intake, decreasing simple carbohydrates, increasing vegetables, increasing water intake, decreasing eating out, no skipping meals, meal planning and cooking strategies, keeping healthy foods in the home and planning for success.  Allison Terrell has agreed to follow-up with our clinic in 2-3 weeks, fasting. She was informed of the importance of frequent follow-up visits to maximize her success with intensive lifestyle modifications for her multiple health conditions.   Objective:   Blood pressure 119/78, pulse 64, temperature (!) 97.4 F (36.3 C), height 5\' 2"  (1.575 m), weight 224 lb (101.6 kg), SpO2 97 %. Body mass index is 40.97 kg/m.  General: Cooperative, alert, well developed, in no acute distress. HEENT: Conjunctivae and lids unremarkable. Cardiovascular: Regular rhythm.  Lungs: Normal work of breathing. Neurologic: No focal deficits.   Lab  Results  Component Value Date   CREATININE 0.95 09/01/2020   BUN 16 09/01/2020   NA 140 09/01/2020   K 4.3 09/01/2020   CL 103 09/01/2020   CO2 25 09/01/2020   Lab Results  Component Value Date   ALT 18 09/01/2020   AST 19 09/01/2020   ALKPHOS 92  09/01/2020   BILITOT 0.4 09/01/2020   Lab Results  Component Value Date   HGBA1C 4.9 09/01/2020   Lab Results  Component Value Date   INSULIN 11.0 09/01/2020   Lab Results  Component Value Date   TSH 2.670 09/01/2020   Lab Results  Component Value Date   CHOL 173 09/01/2020   HDL 65 09/01/2020   LDLCALC 88 09/01/2020   TRIG 112 09/01/2020   Lab Results  Component Value Date   WBC 9.3 09/01/2020   HGB 12.9 09/01/2020   HCT 40.0 09/01/2020   MCV 91 09/01/2020   PLT 434 09/01/2020   Attestation Statements:   Reviewed by clinician on day of visit: allergies, medications, problem list, medical history, surgical history, family history, social history, and previous encounter notes.  I, Water quality scientist, CMA, am acting as Location manager for [employer], DO  I have reviewed the above documentation for accuracy and completeness, and I agree with the above. Jearld Lesch, DO

## 2020-12-15 ENCOUNTER — Telehealth: Payer: Self-pay | Admitting: Adult Health

## 2020-12-15 NOTE — Telephone Encounter (Signed)
I called pt. She wanted appt with [name] to evaluate that watery drainage that she is having from her nose/ ear.  She had bad cold then super bad headache, then noted when bending over and lifting back up noted watery drainage.  She does not feel sick.  She thinks she has a leak.  She has not been to see her pcp.   I referred her to her pcp to evaluate initially.  If they think needs to see [name] then can refer back to [name].  She wanted to save a copay.  She will call her pcp.

## 2020-12-15 NOTE — Telephone Encounter (Signed)
Pt states she had a cold over a month ago and now when she bends over she has drainage from her nose & ears (thin & watery). Pt unsure if she needs Neurologic treatment or if this needs to be addressed by PCP

## 2020-12-17 ENCOUNTER — Encounter (INDEPENDENT_AMBULATORY_CARE_PROVIDER_SITE_OTHER): Payer: Self-pay | Admitting: Bariatrics

## 2020-12-17 ENCOUNTER — Ambulatory Visit (INDEPENDENT_AMBULATORY_CARE_PROVIDER_SITE_OTHER): Payer: Managed Care, Other (non HMO) | Admitting: Bariatrics

## 2020-12-17 ENCOUNTER — Other Ambulatory Visit: Payer: Self-pay

## 2020-12-17 VITALS — BP 116/78 | HR 69 | Temp 98.1°F | Ht 62.0 in | Wt 222.0 lb

## 2020-12-17 DIAGNOSIS — Z9189 Other specified personal risk factors, not elsewhere classified: Secondary | ICD-10-CM | POA: Diagnosis not present

## 2020-12-17 DIAGNOSIS — Z6841 Body Mass Index (BMI) 40.0 and over, adult: Secondary | ICD-10-CM

## 2020-12-17 DIAGNOSIS — E88819 Insulin resistance, unspecified: Secondary | ICD-10-CM

## 2020-12-17 DIAGNOSIS — E559 Vitamin D deficiency, unspecified: Secondary | ICD-10-CM | POA: Diagnosis not present

## 2020-12-17 DIAGNOSIS — E8881 Metabolic syndrome: Secondary | ICD-10-CM | POA: Diagnosis not present

## 2020-12-17 DIAGNOSIS — G8929 Other chronic pain: Secondary | ICD-10-CM

## 2020-12-17 DIAGNOSIS — M545 Low back pain, unspecified: Secondary | ICD-10-CM

## 2020-12-17 DIAGNOSIS — E662 Morbid (severe) obesity with alveolar hypoventilation: Secondary | ICD-10-CM

## 2020-12-17 DIAGNOSIS — E66813 Obesity, class 3: Secondary | ICD-10-CM

## 2020-12-18 NOTE — Progress Notes (Signed)
Chief Complaint:   OBESITY Allison Terrell is here to discuss her progress with her obesity treatment plan along with follow-up of her obesity related diagnoses. Allison Terrell is on the Category 3 Plan and states she is following her eating plan approximately 75% of the time. Allison Terrell states she is biking for 30 minutes 2 times per week, and walking for 30 minutes 1 time per week.  Today's visit was #: 8 Starting weight: 235 lbs Starting date: 09/01/2020 Today's weight: 222 lbs Today's date: 12/17/2020 Total lbs lost to date: 13 Total lbs lost since last in-office visit: 2  Interim History: Allison Terrell is down an additional 2 lbs. She is doing better with her water and protein intake.  Subjective:   1. Insulin resistance Allison Terrell is taking metformin currently. Last insulin level was 11.0.  2. Vitamin D deficiency Allison Terrell taking multivitamins, and last Vit D level was 47.0  3. Chronic bilateral low back pain without sciatica Allison Terrell notes lower back pain, and she is taking Tylenol.  4. At risk for hypoglycemia Allison Terrell is at increased risk for hypoglycemia due to obesity, and insulin resistance.  Assessment/Plan:   1. Insulin resistance Allison Terrell will continue to work on weight loss, exercise, and decreasing simple carbohydrates to help decrease the risk of diabetes. We will check labs today. Allison Terrell agreed to follow-up with Allison Terrell as directed to closely monitor her progress.  - Hemoglobin A1c - Insulin, random - Comprehensive metabolic panel  2. Vitamin D deficiency Low Vitamin D level contributes to fatigue and are associated with obesity, breast, and colon cancer. We will check labs today. Allison Terrell will follow-up for routine testing of Vitamin D, at least 2-3 times per year to avoid over-replacement.  - VITAMIN D 25 Hydroxy (Vit-D Deficiency, Fractures)  3. Chronic bilateral low back pain without sciatica [name] is to not do any pounding exercise, and she will continue to follow up as directed.  4. At  risk for hypoglycemia Allison Terrell was given approximately 15 minutes of counseling today regarding prevention of hypoglycemia. She was advised of symptoms of hypoglycemia. Allison Terrell was instructed to avoid skipping meals, eat regular protein rich meals and schedule low calorie snacks as needed.   Repetitive spaced learning was employed today to elicit superior memory formation and behavioral change.  5. Obesity, current BMI 40 Allison Terrell is currently in the action stage of change. As such, her goal is to continue with weight loss efforts. She has agreed to the Category 3 Plan.   Intentional eating was discussed.  Exercise goals: As is, increase frequency.  Behavioral modification strategies: increasing lean protein intake, decreasing simple carbohydrates, increasing vegetables, increasing water intake, decreasing eating out, no skipping meals, meal planning and cooking strategies, keeping healthy foods in the home and planning for success.  Allison Terrell has agreed to follow-up with our clinic in 2 weeks. She was informed of the importance of frequent follow-up visits to maximize her success with intensive lifestyle modifications for her multiple health conditions.   Allison Terrell was informed we would discuss her lab results at her next visit unless there is a critical issue that needs to be addressed sooner. Allison Terrell agreed to keep her next visit at the agreed upon time to discuss these results.  Objective:   Blood pressure 116/78, pulse 69, temperature 98.1 F (36.7 C), height 5\' 2"  (1.575 m), weight 222 lb (100.7 kg), SpO2 97 %. Body mass index is 40.6 kg/m.  General: Cooperative, alert, well developed, in no acute distress. HEENT: Conjunctivae and lids unremarkable.  Cardiovascular: Regular rhythm.  Lungs: Normal work of breathing. Neurologic: No focal deficits.   Lab Results  Component Value Date   CREATININE 0.95 09/01/2020   BUN 16 09/01/2020   NA 140 09/01/2020   K 4.3 09/01/2020   CL 103 09/01/2020    CO2 25 09/01/2020   Lab Results  Component Value Date   ALT 18 09/01/2020   AST 19 09/01/2020   ALKPHOS 92 09/01/2020   BILITOT 0.4 09/01/2020   Lab Results  Component Value Date   HGBA1C 4.9 09/01/2020   Lab Results  Component Value Date   INSULIN 11.0 09/01/2020   Lab Results  Component Value Date   TSH 2.670 09/01/2020   Lab Results  Component Value Date   CHOL 173 09/01/2020   HDL 65 09/01/2020   LDLCALC 88 09/01/2020   TRIG 112 09/01/2020   Lab Results  Component Value Date   WBC 9.3 09/01/2020   HGB 12.9 09/01/2020   HCT 40.0 09/01/2020   MCV 91 09/01/2020   PLT 434 09/01/2020   No results found for: IRON, TIBC, FERRITIN  Attestation Statements:   Reviewed by clinician on day of visit: allergies, medications, problem list, medical history, surgical history, family history, social history, and previous encounter notes.   Wilhemena Durie, am acting as Location manager for [employer], DO.  I have reviewed the above documentation for accuracy and completeness, and I agree with the above. Jearld Lesch, DO

## 2020-12-20 LAB — HEMOGLOBIN A1C
Est. average glucose Bld gHb Est-mCnc: 114 mg/dL
Hgb A1c MFr Bld: 5.6 % (ref 4.8–5.6)

## 2020-12-20 LAB — INSULIN, RANDOM: INSULIN: 7.8 u[IU]/mL (ref 2.6–24.9)

## 2020-12-20 LAB — COMPREHENSIVE METABOLIC PANEL
ALT: 19 IU/L (ref 0–32)
AST: 18 IU/L (ref 0–40)
Albumin/Globulin Ratio: 1.7 (ref 1.2–2.2)
Albumin: 4.2 g/dL (ref 3.8–4.8)
Alkaline Phosphatase: 79 IU/L (ref 44–121)
BUN/Creatinine Ratio: 17 (ref 9–23)
BUN: 16 mg/dL (ref 6–24)
Bilirubin Total: 0.5 mg/dL (ref 0.0–1.2)
CO2: 25 mmol/L (ref 20–29)
Calcium: 9.6 mg/dL (ref 8.7–10.2)
Chloride: 104 mmol/L (ref 96–106)
Creatinine, Ser: 0.94 mg/dL (ref 0.57–1.00)
Globulin, Total: 2.5 g/dL (ref 1.5–4.5)
Glucose: 92 mg/dL (ref 65–99)
Potassium: 4.8 mmol/L (ref 3.5–5.2)
Sodium: 142 mmol/L (ref 134–144)
Total Protein: 6.7 g/dL (ref 6.0–8.5)
eGFR: 74 mL/min/{1.73_m2} (ref >59)

## 2020-12-20 LAB — VITAMIN D 25 HYDROXY (VIT D DEFICIENCY, FRACTURES): Vit D, 25-Hydroxy: 97.9 ng/mL (ref 30.0–100.0)

## 2020-12-22 ENCOUNTER — Encounter (INDEPENDENT_AMBULATORY_CARE_PROVIDER_SITE_OTHER): Payer: Self-pay | Admitting: Bariatrics

## 2020-12-22 NOTE — Progress Notes (Signed)
Pt advised.

## 2021-01-01 ENCOUNTER — Other Ambulatory Visit: Payer: Self-pay

## 2021-01-01 ENCOUNTER — Encounter (INDEPENDENT_AMBULATORY_CARE_PROVIDER_SITE_OTHER): Payer: Self-pay | Admitting: Bariatrics

## 2021-01-01 ENCOUNTER — Other Ambulatory Visit (INDEPENDENT_AMBULATORY_CARE_PROVIDER_SITE_OTHER): Payer: Self-pay | Admitting: Bariatrics

## 2021-01-01 ENCOUNTER — Ambulatory Visit (INDEPENDENT_AMBULATORY_CARE_PROVIDER_SITE_OTHER): Payer: Managed Care, Other (non HMO) | Admitting: Bariatrics

## 2021-01-01 VITALS — BP 116/75 | HR 76 | Temp 98.0°F | Ht 62.0 in | Wt 221.0 lb

## 2021-01-01 DIAGNOSIS — E662 Morbid (severe) obesity with alveolar hypoventilation: Secondary | ICD-10-CM | POA: Diagnosis not present

## 2021-01-01 DIAGNOSIS — E8881 Metabolic syndrome: Secondary | ICD-10-CM | POA: Diagnosis not present

## 2021-01-01 DIAGNOSIS — R632 Polyphagia: Secondary | ICD-10-CM

## 2021-01-01 DIAGNOSIS — Z9189 Other specified personal risk factors, not elsewhere classified: Secondary | ICD-10-CM

## 2021-01-01 DIAGNOSIS — E559 Vitamin D deficiency, unspecified: Secondary | ICD-10-CM | POA: Diagnosis not present

## 2021-01-01 DIAGNOSIS — Z6841 Body Mass Index (BMI) 40.0 and over, adult: Secondary | ICD-10-CM

## 2021-01-01 MED ORDER — INSULIN PEN NEEDLE 32G X 4 MM MISC: 0 refills | Status: DC

## 2021-01-01 MED ORDER — VICTOZA 18 MG/3ML ~~LOC~~ SOPN: PEN_INJECTOR | SUBCUTANEOUS | 0 refills | Status: DC

## 2021-01-01 MED ORDER — METFORMIN HCL 500 MG PO TABS: 500.0000 mg | ORAL_TABLET | Freq: Every day | ORAL | 0 refills | Status: DC

## 2021-01-01 MED ORDER — TRULICITY 0.75 MG/0.5ML ~~LOC~~ SOAJ: 0.7500 mg | SUBCUTANEOUS | 0 refills | Status: DC

## 2021-01-01 NOTE — Telephone Encounter (Signed)
Would you like to switch to any of these?

## 2021-01-02 ENCOUNTER — Other Ambulatory Visit (INDEPENDENT_AMBULATORY_CARE_PROVIDER_SITE_OTHER): Payer: Self-pay | Admitting: Bariatrics

## 2021-01-06 NOTE — Telephone Encounter (Signed)
Dr.Brown 

## 2021-01-07 NOTE — Progress Notes (Signed)
Chief Complaint:   OBESITY Allison Terrell is here to discuss her progress with her obesity treatment plan along with follow-up of her obesity related diagnoses. Allison Terrell is on the Category 3 Plan and states she is following her eating plan approximately 70% of the time. Allison Terrell states she is doing 0 minutes 0 times per week.  Today's visit was #: 9 Starting weight: 235 lbs Starting date: 09/01/2020 Today's weight: 221 lbs Today's date: 01/01/2021 Total lbs lost to date: 14 Total lbs lost since last in-office visit: 1  Interim History: Allison Terrell is down an additional lb. She has tried to journal but she is not consistent.  Subjective:   1. Insulin resistance Allison Terrell is taking metformin.  2. Vitamin D deficiency Allison Terrell is taking multivitamins.  3. Polyphagia Allison Terrell is not taking medication.  4. At risk for activity intolerance Allison Terrell is at risk for exercise intolerance due to obesity.  Assessment/Plan:   1. Insulin resistance Allison Terrell will continue metformin, and we will refill cor 1 month. She will continue to work on weight loss, exercise, and decreasing simple carbohydrates to help decrease the risk of diabetes. Allison Terrell agreed to follow-up with [name] as directed to closely monitor her progress.  - metFORMIN (GLUCOPHAGE) 500 MG tablet; Take 1 tablet (500 mg total) by mouth daily with lunch.  Dispense: 30 tablet; Refill: 0  2. Vitamin D deficiency Low Vitamin D level contributes to fatigue and are associated with obesity, breast, and colon cancer. Allison Terrell will continue taking multivitamins and will follow-up for routine testing of Vitamin D, at least 2-3 times per year to avoid over-replacement.  3. Polyphagia Intensive lifestyle modifications are the first line treatment for this issue. We discussed several lifestyle modifications today. Allison Terrell agreed to start Victoza 18 mg, (she is to start at 0.6 mg SubQ once daily for 7 days, then increase to 1.2 mg once daily) with no refills; and pen  needles #100 with no refills. She will continue to work on diet, exercise and weight loss efforts. Orders and follow up as documented in patient record.  Counseling . Polyphagia is excessive hunger. . Causes can include: low blood sugars, hyperthyroidism, PMS, lack of sleep, stress, insulin resistance, diabetes, certain medications, and diets that are deficient in protein and fiber.   4. At risk for activity intolerance Allison Terrell was given approximately 15 minutes of exercise intolerance counseling today. She is 50 y.o. female and has risk factors exercise intolerance including obesity. We discussed intensive lifestyle modifications today with an emphasis on specific weight loss instructions and strategies. Allison Terrell will slowly increase activity as tolerated.  Repetitive spaced learning was employed today to elicit superior memory formation and behavioral change.  5. Obesity, current BMI 40 Allison Terrell is currently in the action stage of change. As such, her goal is to continue with weight loss efforts. She has agreed to the Category 3 Plan or keeping a food journal and adhering to recommended goals of 1500 calories and 80-90 grams of protein daily.   Intentional eating was discussed. Allison Terrell will be more adherent to the meal plan. Patient is using the application [employer] with [place].  Exercise goals: Will do some walking.  Behavioral modification strategies: increasing lean protein intake, decreasing simple carbohydrates, increasing vegetables, increasing water intake, decreasing eating out, no skipping meals, meal planning and cooking strategies, keeping healthy foods in the home and planning for success.  Neal has agreed to follow-up with our clinic in 2 to 3 weeks. She was informed of the importance of  frequent follow-up visits to maximize her success with intensive lifestyle modifications for her multiple health conditions.   Objective:   Blood pressure 116/75, pulse 76, temperature 98 F (36.7 C),  height 5\' 2"  (1.575 m), weight 221 lb (100.2 kg), SpO2 94 %. Body mass index is 40.42 kg/m.  General: Cooperative, alert, well developed, in no acute distress. HEENT: Conjunctivae and lids unremarkable. Cardiovascular: Regular rhythm.  Lungs: Normal work of breathing. Neurologic: No focal deficits.   Lab Results  Component Value Date   CREATININE 0.94 12/17/2020   BUN 16 12/17/2020   NA 142 12/17/2020   K 4.8 12/17/2020   CL 104 12/17/2020   CO2 25 12/17/2020   Lab Results  Component Value Date   ALT 19 12/17/2020   AST 18 12/17/2020   ALKPHOS 79 12/17/2020   BILITOT 0.5 12/17/2020   Lab Results  Component Value Date   HGBA1C 5.6 12/17/2020   HGBA1C 4.9 09/01/2020   Lab Results  Component Value Date   INSULIN 7.8 12/17/2020   INSULIN 11.0 09/01/2020   Lab Results  Component Value Date   TSH 2.670 09/01/2020   Lab Results  Component Value Date   CHOL 173 09/01/2020   HDL 65 09/01/2020   LDLCALC 88 09/01/2020   TRIG 112 09/01/2020   Lab Results  Component Value Date   WBC 9.3 09/01/2020   HGB 12.9 09/01/2020   HCT 40.0 09/01/2020   MCV 91 09/01/2020   PLT 434 09/01/2020   No results found for: IRON, TIBC, FERRITIN  Attestation Statements:   Reviewed by clinician on day of visit: allergies, medications, problem list, medical history, surgical history, family history, social history, and previous encounter notes.   Wilhemena Durie, am acting as Location manager for [employer], DO.  I have reviewed the above documentation for accuracy and completeness, and I agree with the above. Jearld Lesch, DO

## 2021-01-08 ENCOUNTER — Encounter (INDEPENDENT_AMBULATORY_CARE_PROVIDER_SITE_OTHER): Payer: Self-pay | Admitting: Bariatrics

## 2021-01-22 ENCOUNTER — Ambulatory Visit (INDEPENDENT_AMBULATORY_CARE_PROVIDER_SITE_OTHER): Payer: Managed Care, Other (non HMO) | Admitting: Bariatrics

## 2021-02-06 ENCOUNTER — Other Ambulatory Visit (INDEPENDENT_AMBULATORY_CARE_PROVIDER_SITE_OTHER): Payer: Self-pay | Admitting: Bariatrics

## 2021-02-06 DIAGNOSIS — E8881 Metabolic syndrome: Secondary | ICD-10-CM

## 2021-02-10 NOTE — Telephone Encounter (Signed)
Pt last seen by Dr. Brown.  

## 2021-02-12 ENCOUNTER — Ambulatory Visit (INDEPENDENT_AMBULATORY_CARE_PROVIDER_SITE_OTHER): Payer: Managed Care, Other (non HMO) | Admitting: Bariatrics

## 2021-02-12 ENCOUNTER — Other Ambulatory Visit: Payer: Self-pay

## 2021-02-12 VITALS — BP 120/80 | HR 72 | Temp 97.9°F | Ht 62.0 in | Wt 219.0 lb

## 2021-02-12 DIAGNOSIS — E8881 Metabolic syndrome: Secondary | ICD-10-CM

## 2021-02-12 DIAGNOSIS — E662 Morbid (severe) obesity with alveolar hypoventilation: Secondary | ICD-10-CM

## 2021-02-12 DIAGNOSIS — Z6841 Body Mass Index (BMI) 40.0 and over, adult: Secondary | ICD-10-CM

## 2021-02-12 DIAGNOSIS — R632 Polyphagia: Secondary | ICD-10-CM | POA: Diagnosis not present

## 2021-02-12 DIAGNOSIS — Z9189 Other specified personal risk factors, not elsewhere classified: Secondary | ICD-10-CM | POA: Diagnosis not present

## 2021-02-12 MED ORDER — METFORMIN HCL 500 MG PO TABS: 500.0000 mg | ORAL_TABLET | Freq: Every day | ORAL | 0 refills | Status: DC

## 2021-02-18 ENCOUNTER — Encounter (INDEPENDENT_AMBULATORY_CARE_PROVIDER_SITE_OTHER): Payer: Self-pay | Admitting: Bariatrics

## 2021-02-18 NOTE — Progress Notes (Signed)
Chief Complaint:   OBESITY Allison Terrell is here to discuss her progress with her obesity treatment plan along with follow-up of her obesity related diagnoses. Allison Terrell is on the Category 3 Plan and states she is following her eating plan approximately 60% of the time. Allison Terrell states she is walking, squats, and wall push ups 30 minutes 2-3 times per week.  Today's visit was #: 10 Starting weight: 235 lbs Starting date: 09/01/2020 Today's weight: 219 lbs Today's date: 02/12/2021 Total lbs lost to date: 16 Total lbs lost since last in-office visit: 2  Interim History: Allison Terrell is down 2 lbs since her last visit. She states that she is not doing enough of anything.  Subjective:   1. Insulin resistance Allison Terrell is taking her medication as directed.  Lab Results  Component Value Date   INSULIN 7.8 12/17/2020   INSULIN 11.0 09/01/2020   Lab Results  Component Value Date   HGBA1C 5.6 12/17/2020   2. Polyphagia Allison Terrell is taking Metformin.  3. At risk for diabetes mellitus Allison Terrell is at higher than average risk for developing diabetes due to obesity and insulin resistance.  Assessment/Plan:   1. Insulin resistance Allison Terrell will continue to work on weight loss, exercise, and decreasing simple carbohydrates to help decrease the risk of diabetes. Allison Terrell agreed to follow-up with [name] as directed to closely monitor her progress.  Refill- metFORMIN (GLUCOPHAGE) 500 MG tablet; Take 1 tablet (500 mg total) by mouth daily with lunch.  Dispense: 30 tablet; Refill: 0  2. Polyphagia Intensive lifestyle modifications are the first line treatment for this issue. We discussed several lifestyle modifications today and she will continue to work on diet, exercise and weight loss efforts. Orders and follow up as documented in patient record. Continue Metformin. Increase raw vegetables and protein.  Counseling Polyphagia is excessive hunger. Causes can include: low blood sugars, hypERthyroidism, PMS, lack of  sleep, stress, insulin resistance, diabetes, certain medications, and diets that are deficient in protein and fiber.   3. At risk for diabetes mellitus Allison Terrell was given approximately 15 minutes of diabetes education and counseling today. We discussed intensive lifestyle modifications today with an emphasis on weight loss as well as increasing exercise and decreasing simple carbohydrates in her diet. We also reviewed medication options with an emphasis on risk versus benefit of those discussed.   Repetitive spaced learning was employed today to elicit superior memory formation and behavioral change.  4. Obesity, current BMI 40.1  Allison Terrell is currently in the action stage of change. As such, her goal is to continue with weight loss efforts. She has agreed to the Category 3 Plan.   Meal plan Intentional eating Increase water intake. Use water app. 5 big sips.  Exercise goals:  As is  Behavioral modification strategies: increasing lean protein intake, decreasing simple carbohydrates, increasing vegetables, increasing water intake, decreasing eating out, no skipping meals, meal planning and cooking strategies, keeping healthy foods in the home, and planning for success.  Allison Terrell has agreed to follow-up with our clinic in 2-3 weeks. She was informed of the importance of frequent follow-up visits to maximize her success with intensive lifestyle modifications for her multiple health conditions.   Objective:   Blood pressure 120/80, pulse 72, temperature 97.9 F (36.6 C), height 5\' 2"  (1.575 m), weight 219 lb (99.3 kg), SpO2 98 %. Body mass index is 40.06 kg/m.  General: Cooperative, alert, well developed, in no acute distress. HEENT: Conjunctivae and lids unremarkable. Cardiovascular: Regular rhythm.  Lungs: Normal work  of breathing. Neurologic: No focal deficits.   Lab Results  Component Value Date   CREATININE 0.94 12/17/2020   BUN 16 12/17/2020   NA 142 12/17/2020   K 4.8 12/17/2020    CL 104 12/17/2020   CO2 25 12/17/2020   Lab Results  Component Value Date   ALT 19 12/17/2020   AST 18 12/17/2020   ALKPHOS 79 12/17/2020   BILITOT 0.5 12/17/2020   Lab Results  Component Value Date   HGBA1C 5.6 12/17/2020   HGBA1C 4.9 09/01/2020   Lab Results  Component Value Date   INSULIN 7.8 12/17/2020   INSULIN 11.0 09/01/2020   Lab Results  Component Value Date   TSH 2.670 09/01/2020   Lab Results  Component Value Date   CHOL 173 09/01/2020   HDL 65 09/01/2020   LDLCALC 88 09/01/2020   TRIG 112 09/01/2020   Lab Results  Component Value Date   VD25OH 97.9 12/17/2020   VD25OH 47.0 09/01/2020   Lab Results  Component Value Date   WBC 9.3 09/01/2020   HGB 12.9 09/01/2020   HCT 40.0 09/01/2020   MCV 91 09/01/2020   PLT 434 09/01/2020    Attestation Statements:   Reviewed by clinician on day of visit: allergies, medications, problem list, medical history, surgical history, family history, social history, and previous encounter notes.  Coral Ceo, CMA, am acting as Location manager for [employer], DO.  I have reviewed the above documentation for accuracy and completeness, and I agree with the above. Jearld Lesch, DO

## 2021-03-09 ENCOUNTER — Ambulatory Visit (INDEPENDENT_AMBULATORY_CARE_PROVIDER_SITE_OTHER): Payer: Managed Care, Other (non HMO) | Admitting: Bariatrics

## 2021-03-09 ENCOUNTER — Encounter (INDEPENDENT_AMBULATORY_CARE_PROVIDER_SITE_OTHER): Payer: Self-pay | Admitting: Bariatrics

## 2021-03-09 ENCOUNTER — Other Ambulatory Visit: Payer: Self-pay

## 2021-03-09 VITALS — BP 112/77 | HR 64 | Temp 97.6°F | Ht 62.0 in | Wt 216.0 lb

## 2021-03-09 DIAGNOSIS — E559 Vitamin D deficiency, unspecified: Secondary | ICD-10-CM | POA: Diagnosis not present

## 2021-03-09 DIAGNOSIS — Z9189 Other specified personal risk factors, not elsewhere classified: Secondary | ICD-10-CM | POA: Diagnosis not present

## 2021-03-09 DIAGNOSIS — R632 Polyphagia: Secondary | ICD-10-CM

## 2021-03-09 DIAGNOSIS — E8881 Metabolic syndrome: Secondary | ICD-10-CM

## 2021-03-09 DIAGNOSIS — E662 Morbid (severe) obesity with alveolar hypoventilation: Secondary | ICD-10-CM

## 2021-03-09 DIAGNOSIS — Z6841 Body Mass Index (BMI) 40.0 and over, adult: Secondary | ICD-10-CM

## 2021-03-09 MED ORDER — METFORMIN HCL 500 MG PO TABS: 500.0000 mg | ORAL_TABLET | Freq: Every day | ORAL | 0 refills | Status: DC

## 2021-03-10 NOTE — Progress Notes (Signed)
Chief Complaint:   OBESITY Allison Terrell is here to discuss her progress with her obesity treatment plan along with follow-up of her obesity related diagnoses. Allison Terrell is on the Category 3 Plan and states she is following her eating plan approximately 70% of the time. Allison Terrell states she is walking for 30 minutes 2 times per week.  Today's visit was #: 11 Starting weight: 235 lbs Starting date: 09/01/2020 Today's weight: 216 lbs Today's date:03/09/2021 Total lbs lost to date: 19 lbs Total lbs lost since last in-office visit: 3 lbs  Interim History: Allison Terrell is down another 3 lbs.  Subjective:   1. Insulin resistance Allison Terrell is taking Metformin.  2. Polyphagia Allison Terrell is taking Metformin. She has increased hunger.  3. Vitamin D deficiency Allison Terrell is taking Vitamin D OTC.  4. At risk for activity intolerance Allison Terrell is at risk for activity intolerance due to obesity and weather.  Assessment/Plan:   1. Insulin resistance Allison Terrell will continue to work on weight loss, exercise, and decreasing simple carbohydrates to help decrease the risk of diabetes. We will refill Metformin for 1 month with no refills. Allison Terrell agreed to follow-up with [name] as directed to closely monitor progress.  - metFORMIN (GLUCOPHAGE) 500 MG tablet; Take 1 tablet (500 mg total) by mouth daily with lunch.  Dispense: 30 tablet; Refill: 0  2. Polyphagia Allison Terrell will increase protein and healthy fats. She will decrease carbohydrates. She will continue to walk. Intensive lifestyle modifications are the first line treatment for this issue. We discussed several lifestyle modifications today and she will continue to work on diet, exercise and weight loss efforts. Orders and follow up as documented in patient record.  Counseling Polyphagia is excessive hunger. Causes can include: low blood sugars, hypERthyroidism, PMS, lack of sleep, stress, insulin resistance, diabetes, certain medications, and diets that are deficient in protein  and fiber.    3. Vitamin D deficiency Low Vitamin D level contributes to fatigue and are associated with obesity, breast, and colon cancer. She agrees to continue to take OTC Vitamin D and Allison Terrell will follow-up for routine testing of Vitamin D, at least 2-3 times per year to avoid over-replacement.   4. At risk for activity intolerance Allison Terrell was given approximately 15 minutes of exercise intolerance counseling today. She is 50 y.o. female and has risk factors exercise intolerance including obesity. We discussed intensive lifestyle modifications today with an emphasis on specific weight loss instructions and strategies. Allison Terrell will slowly increase activity as tolerated.  Repetitive spaced learning was employed today to elicit superior memory formation and behavioral change.   5. Obesity, current BMI 39.6 Allison Terrell is currently in the action stage of change. As such, her goal is to continue with weight loss efforts. She has agreed to the Category 3 Plan.   Allison Terrell will continue meal planning. She will continue intentional eating. Recipes II was given today.  Exercise goals: No exercise has been prescribed at this time.  Behavioral modification strategies: increasing lean protein intake, decreasing simple carbohydrates, increasing vegetables, increasing water intake, decreasing eating out, no skipping meals, meal planning and cooking strategies, keeping healthy foods in the home, and planning for success.  Allison Terrell has agreed to follow-up with our clinic in 3 weeks. She was informed of the importance of frequent follow-up visits to maximize her success with intensive lifestyle modifications for her multiple health conditions.   Objective:   Blood pressure 112/77, pulse 64, temperature 97.6 F (36.4 C), height '5\' 2"'$  (1.575 m), weight 216 lb (98  kg), SpO2 98 %. Body mass index is 39.51 kg/m.  General: Cooperative, alert, well developed, in no acute distress. HEENT: Conjunctivae and lids  unremarkable. Cardiovascular: Regular rhythm.  Lungs: Normal work of breathing. Neurologic: No focal deficits.   Lab Results  Component Value Date   CREATININE 0.94 12/17/2020   BUN 16 12/17/2020   NA 142 12/17/2020   K 4.8 12/17/2020   CL 104 12/17/2020   CO2 25 12/17/2020   Lab Results  Component Value Date   ALT 19 12/17/2020   AST 18 12/17/2020   ALKPHOS 79 12/17/2020   BILITOT 0.5 12/17/2020   Lab Results  Component Value Date   HGBA1C 5.6 12/17/2020   HGBA1C 4.9 09/01/2020   Lab Results  Component Value Date   INSULIN 7.8 12/17/2020   INSULIN 11.0 09/01/2020   Lab Results  Component Value Date   TSH 2.670 09/01/2020   Lab Results  Component Value Date   CHOL 173 09/01/2020   HDL 65 09/01/2020   LDLCALC 88 09/01/2020   TRIG 112 09/01/2020   Lab Results  Component Value Date   VD25OH 97.9 12/17/2020   VD25OH 47.0 09/01/2020   Lab Results  Component Value Date   WBC 9.3 09/01/2020   HGB 12.9 09/01/2020   HCT 40.0 09/01/2020   MCV 91 09/01/2020   PLT 434 09/01/2020   No results found for: IRON, TIBC, FERRITIN  Attestation Statements:   Reviewed by clinician on day of visit: allergies, medications, problem list, medical history, surgical history, family history, social history, and previous encounter notes.  I, [name], RMA, am acting as Location manager for [employer], DO.   I have reviewed the above documentation for accuracy and completeness, and I agree with the above. Jearld Lesch, DO

## 2021-03-11 ENCOUNTER — Encounter (INDEPENDENT_AMBULATORY_CARE_PROVIDER_SITE_OTHER): Payer: Self-pay | Admitting: Bariatrics

## 2021-04-02 ENCOUNTER — Other Ambulatory Visit: Payer: Self-pay

## 2021-04-02 ENCOUNTER — Ambulatory Visit (INDEPENDENT_AMBULATORY_CARE_PROVIDER_SITE_OTHER): Payer: Managed Care, Other (non HMO) | Admitting: Bariatrics

## 2021-04-02 ENCOUNTER — Encounter (INDEPENDENT_AMBULATORY_CARE_PROVIDER_SITE_OTHER): Payer: Self-pay | Admitting: Bariatrics

## 2021-04-02 VITALS — BP 117/79 | HR 71 | Temp 97.7°F | Ht 62.0 in | Wt 218.0 lb

## 2021-04-02 DIAGNOSIS — E662 Morbid (severe) obesity with alveolar hypoventilation: Secondary | ICD-10-CM | POA: Diagnosis not present

## 2021-04-02 DIAGNOSIS — Z6841 Body Mass Index (BMI) 40.0 and over, adult: Secondary | ICD-10-CM

## 2021-04-02 DIAGNOSIS — E8881 Metabolic syndrome: Secondary | ICD-10-CM

## 2021-04-02 DIAGNOSIS — Z9189 Other specified personal risk factors, not elsewhere classified: Secondary | ICD-10-CM

## 2021-04-02 DIAGNOSIS — R632 Polyphagia: Secondary | ICD-10-CM | POA: Diagnosis not present

## 2021-04-02 DIAGNOSIS — E66813 Obesity, class 3: Secondary | ICD-10-CM

## 2021-04-02 DIAGNOSIS — E88819 Insulin resistance, unspecified: Secondary | ICD-10-CM

## 2021-04-02 MED ORDER — METFORMIN HCL 500 MG PO TABS: 500.0000 mg | ORAL_TABLET | Freq: Every day | ORAL | 0 refills | Status: DC

## 2021-04-06 ENCOUNTER — Encounter (INDEPENDENT_AMBULATORY_CARE_PROVIDER_SITE_OTHER): Payer: Self-pay | Admitting: Bariatrics

## 2021-04-06 NOTE — Progress Notes (Signed)
Chief Complaint:   OBESITY Allison Terrell is here to discuss her progress with her obesity treatment plan along with follow-up of her obesity related diagnoses. Allison Terrell is on the Category 3 Plan and states she is following her eating plan approximately 60% of the time. Allison Terrell states she is walking for 60 minutes 3 times per week.  Today's visit was #: 12 Starting weight: 235 lbs Starting date: 09/01/2020 Today's weight: 218 lbs Today's date: 04/02/2021 Total lbs lost to date: 17 lbs Total lbs lost since last in-office visit: 0  Interim History: Allison Terrell is up 2 lbs since her last visit. She just recently got home from a trip to [place]. According to the bioimpedance scale her body fat is stable and her water is up.  Subjective:   1. Insulin resistance Allison Terrell is currently taking Metformin.  2. Brandlyn Allison Terrell is currently taking Metformin to decrease carbohydrates.  3. At risk for diabetes mellitus Allison Terrell is at risk for diabetes mellitus due to Insulin resistance and obesity.  Assessment/Plan:   1. Insulin resistance Allison Terrell will continue to work on weight loss, exercise, and decreasing simple carbohydrates to help decrease the risk of diabetes. We will refill Metformin 500 mg by mouth daily with lunch for 1 month with no refills. Allison Terrell agreed to follow-up with [name] as directed to closely monitor her progress.  - metFORMIN (GLUCOPHAGE) 500 MG tablet; Take 1 tablet (500 mg total) by mouth daily with lunch.  Dispense: 30 tablet; Refill: 0  2. Polyphagia Intensive lifestyle modifications are the first line treatment for this issue. We discussed several lifestyle modifications today and she will continue to work on diet, exercise and weight loss efforts. Allison Terrell will continue to decrease carbohydrates and she will continue activities and exercise. Orders and follow up as documented in patient record.  Counseling Polyphagia is excessive hunger. Causes can include: low blood sugars,  hypERthyroidism, PMS, lack of sleep, stress, insulin resistance, diabetes, certain medications, and diets that are deficient in protein and fiber.    3. At risk for diabetes mellitus Allison Terrell was given approximately 15 minutes of diabetes education and counseling today. We discussed intensive lifestyle modifications today with an emphasis on weight loss as well as increasing exercise and decreasing simple carbohydrates in her diet. We also reviewed medication options with an emphasis on risk versus benefit of those discussed.   Repetitive spaced learning was employed today to elicit superior memory formation and behavioral change.   4. Obesity, current BMI 39.9 Allison Terrell is currently in the action stage of change. As such, her goal is to continue with weight loss efforts. She has agreed to the Category 3 Plan.   Allison Terrell will stay strongly adherent to the plan. She will continue increasing high protein foods .   Exercise goals:  As is.  Behavioral modification strategies: increasing lean protein intake, decreasing simple carbohydrates, increasing vegetables, increasing water intake, decreasing eating out, no skipping meals, meal planning and cooking strategies, keeping healthy foods in the home, and planning for success.  Allison Terrell has agreed to follow-up with our clinic in 3 weeks. She was informed of the importance of frequent follow-up visits to maximize her success with intensive lifestyle modifications for her multiple health conditions.   Objective:   Pulse 71, temperature 97.7 F (36.5 C), height '5\' 2"'$  (1.575 m), weight 218 lb (98.9 kg), SpO2 97 %. Body mass index is 39.87 kg/m.  General: Cooperative, alert, well developed, in no acute distress. HEENT: Conjunctivae and lids unremarkable. Cardiovascular: Regular  rhythm.  Lungs: Normal work of breathing. Neurologic: No focal deficits.   Lab Results  Component Value Date   CREATININE 0.94 12/17/2020   BUN 16 12/17/2020   NA 142  12/17/2020   K 4.8 12/17/2020   CL 104 12/17/2020   CO2 25 12/17/2020   Lab Results  Component Value Date   ALT 19 12/17/2020   AST 18 12/17/2020   ALKPHOS 79 12/17/2020   BILITOT 0.5 12/17/2020   Lab Results  Component Value Date   HGBA1C 5.6 12/17/2020   HGBA1C 4.9 09/01/2020   Lab Results  Component Value Date   INSULIN 7.8 12/17/2020   INSULIN 11.0 09/01/2020   Lab Results  Component Value Date   TSH 2.670 09/01/2020   Lab Results  Component Value Date   CHOL 173 09/01/2020   HDL 65 09/01/2020   LDLCALC 88 09/01/2020   TRIG 112 09/01/2020   Lab Results  Component Value Date   VD25OH 97.9 12/17/2020   VD25OH 47.0 09/01/2020   Lab Results  Component Value Date   WBC 9.3 09/01/2020   HGB 12.9 09/01/2020   HCT 40.0 09/01/2020   MCV 91 09/01/2020   PLT 434 09/01/2020   No results found for: IRON, TIBC, FERRITIN  Attestation Statements:   Reviewed by clinician on day of visit: allergies, medications, problem list, medical history, surgical history, family history, social history, and previous encounter notes.  I, [name], RMA, am acting as Location manager for [employer], [place].   I have reviewed the above documentation for accuracy and completeness, and I agree with the above. Allison Lesch, DO

## 2021-04-13 ENCOUNTER — Other Ambulatory Visit (INDEPENDENT_AMBULATORY_CARE_PROVIDER_SITE_OTHER): Payer: Self-pay | Admitting: Bariatrics

## 2021-04-13 DIAGNOSIS — E8881 Metabolic syndrome: Secondary | ICD-10-CM

## 2021-04-23 ENCOUNTER — Encounter (INDEPENDENT_AMBULATORY_CARE_PROVIDER_SITE_OTHER): Payer: Self-pay | Admitting: Family Medicine

## 2021-04-23 ENCOUNTER — Ambulatory Visit (INDEPENDENT_AMBULATORY_CARE_PROVIDER_SITE_OTHER): Payer: Managed Care, Other (non HMO) | Admitting: Family Medicine

## 2021-04-23 ENCOUNTER — Other Ambulatory Visit: Payer: Self-pay

## 2021-04-23 VITALS — BP 100/65 | HR 72 | Temp 97.7°F | Ht 62.0 in | Wt 216.0 lb

## 2021-04-23 DIAGNOSIS — E662 Morbid (severe) obesity with alveolar hypoventilation: Secondary | ICD-10-CM

## 2021-04-23 DIAGNOSIS — E8881 Metabolic syndrome: Secondary | ICD-10-CM | POA: Diagnosis not present

## 2021-04-23 DIAGNOSIS — G4733 Obstructive sleep apnea (adult) (pediatric): Secondary | ICD-10-CM

## 2021-04-23 DIAGNOSIS — Z9189 Other specified personal risk factors, not elsewhere classified: Secondary | ICD-10-CM

## 2021-04-23 DIAGNOSIS — E559 Vitamin D deficiency, unspecified: Secondary | ICD-10-CM

## 2021-04-23 DIAGNOSIS — Z9989 Dependence on other enabling machines and devices: Secondary | ICD-10-CM

## 2021-04-23 DIAGNOSIS — Z6841 Body Mass Index (BMI) 40.0 and over, adult: Secondary | ICD-10-CM

## 2021-04-23 MED ORDER — TIRZEPATIDE 2.5 MG/0.5ML ~~LOC~~ SOAJ
2.5000 mg | SUBCUTANEOUS | 0 refills | Status: DC
Start: 2021-04-23 — End: 2021-05-14

## 2021-04-23 NOTE — Progress Notes (Signed)
Chief Complaint:   OBESITY Allison Terrell is here to discuss her progress with her obesity treatment plan along with follow-up of her obesity related diagnoses. Allison Terrell is on the Category 3 Plan and states she is following her eating plan approximately 70% of the time. Allison Terrell states she is doing 0 minutes 0 times per week.  Today's visit was #: 64 Starting weight: 235 lbs Starting date: 09/01/2020 Today's weight: 216 lbs Today's date: 04/23/2021 Total lbs lost to date: 19 lbs Total lbs lost since last in-office visit: 2 lbs  Interim History: Allison Terrell is doing a great job on the plan. She is getting in adequate protein but surprised how much is requied. Her hunger is satisfied.  Subjective:   1. Insulin resistance Allison Terrell is on Metformin. She is tolerating it well. She notes more hunger at night.  Lab Results  Component Value Date   INSULIN 7.8 12/17/2020   INSULIN 11.0 09/01/2020   Lab Results  Component Value Date   HGBA1C 5.6 12/17/2020    2. OSA on CPAP Allison Terrell wears CPAP consistently. She notes feeling refreshed after a night's sleep. She wears it 6.7 hrs a night.   3. Vitamin D deficiency Allison Terrell's Vitamin D was nearly over placed when checked in May 2022 (97.9). She has been off of supplementation since then.   Lab Results  Component Value Date   VD25OH 97.9 12/17/2020   VD25OH 47.0 09/01/2020    4. At risk for activity intolerance Allison Terrell is at risk for activity intolerance due to lack of exercise.  Assessment/Plan:   1. Insulin resistance Allison Terrell will discontinue Metformin. Allison Terrell agrees to start new prescription of Mounjaro 2.5 mg weekly with no refills. - tirzepatide [facility]) 2.5 MG/0.5ML Pen; Inject 2.5 mg into the skin once a week.  Dispense: 2 mL; Refill: 0 - Hemoglobin A1c - Insulin, random  2. OSA on CPAP  Allison Terrell will continue to use CPAP consistently. We will continue to monitor.   3. Vitamin D deficiency  We will check labs today.  - VITAMIN D 25  Hydroxy (Vit-D Deficiency, Fractures)  4. At risk for activity intolerance Allison Terrell was given approximately 15 minutes of exercise intolerance counseling today. She is 50 y.o. female and has risk factors exercise intolerance including obesity. We discussed intensive lifestyle modifications today with an emphasis on specific weight loss instructions and strategies. Allison Terrell will slowly increase activity as tolerated.  Repetitive spaced learning was employed today to elicit superior memory formation and behavioral change.   5. Obesity, current BMI 39.9 Allison Terrell is currently in the action stage of change. As such, her goal is to continue with weight loss efforts. She has agreed to the Category 3 Plan.   Exercise goals:  Allison Terrell was encouraged to walk 2 days per week.  Behavioral modification strategies: increasing lean protein intake and decreasing simple carbohydrates.  Allison Terrell has agreed to follow-up with our clinic in 3 weeks.  Objective:   Blood pressure 100/65, pulse 72, temperature 97.7 F (36.5 C), height '5\' 2"'$  (1.575 m), weight 216 lb (98 kg), SpO2 99 %. Body mass index is 39.51 kg/m.  General: Cooperative, alert, well developed, in no acute distress. HEENT: Conjunctivae and lids unremarkable. Cardiovascular: Regular rhythm.  Lungs: Normal work of breathing. Neurologic: No focal deficits.   Lab Results  Component Value Date   CREATININE 0.94 12/17/2020   BUN 16 12/17/2020   NA 142 12/17/2020   K 4.8 12/17/2020   CL 104 12/17/2020   CO2 25 12/17/2020  Lab Results  Component Value Date   ALT 19 12/17/2020   AST 18 12/17/2020   ALKPHOS 79 12/17/2020   BILITOT 0.5 12/17/2020   Lab Results  Component Value Date   HGBA1C 5.6 12/17/2020   HGBA1C 4.9 09/01/2020   Lab Results  Component Value Date   INSULIN 7.8 12/17/2020   INSULIN 11.0 09/01/2020   Lab Results  Component Value Date   TSH 2.670 09/01/2020   Lab Results  Component Value Date   CHOL 173 09/01/2020    HDL 65 09/01/2020   LDLCALC 88 09/01/2020   TRIG 112 09/01/2020   Lab Results  Component Value Date   VD25OH 97.9 12/17/2020   VD25OH 47.0 09/01/2020   Lab Results  Component Value Date   WBC 9.3 09/01/2020   HGB 12.9 09/01/2020   HCT 40.0 09/01/2020   MCV 91 09/01/2020   PLT 434 09/01/2020   No results found for: IRON, TIBC, FERRITIN  Attestation Statements:   Reviewed by clinician on day of visit: allergies, medications, problem list, medical history, surgical history, family history, social history, and previous encounter notes.   I, [name], RMA, Allison acting as Location manager for [employer], [place].   I have reviewed the above documentation for accuracy and completeness, and I agree with the above. -  Georgianne Fick, FNP

## 2021-04-24 LAB — INSULIN, RANDOM: INSULIN: 12.7 u[IU]/mL (ref 2.6–24.9)

## 2021-04-24 LAB — HEMOGLOBIN A1C
Est. average glucose Bld gHb Est-mCnc: 105 mg/dL
Hgb A1c MFr Bld: 5.3 % (ref 4.8–5.6)

## 2021-04-24 LAB — VITAMIN D 25 HYDROXY (VIT D DEFICIENCY, FRACTURES): Vit D, 25-Hydroxy: 61.5 ng/mL (ref 30.0–100.0)

## 2021-04-25 DIAGNOSIS — E8881 Metabolic syndrome: Secondary | ICD-10-CM | POA: Insufficient documentation

## 2021-04-25 DIAGNOSIS — E88819 Insulin resistance, unspecified: Secondary | ICD-10-CM | POA: Insufficient documentation

## 2021-04-25 DIAGNOSIS — E559 Vitamin D deficiency, unspecified: Secondary | ICD-10-CM | POA: Insufficient documentation

## 2021-04-29 ENCOUNTER — Encounter (INDEPENDENT_AMBULATORY_CARE_PROVIDER_SITE_OTHER): Payer: Self-pay

## 2021-05-14 ENCOUNTER — Other Ambulatory Visit: Payer: Self-pay

## 2021-05-14 ENCOUNTER — Encounter (INDEPENDENT_AMBULATORY_CARE_PROVIDER_SITE_OTHER): Payer: Self-pay | Admitting: Bariatrics

## 2021-05-14 ENCOUNTER — Ambulatory Visit (INDEPENDENT_AMBULATORY_CARE_PROVIDER_SITE_OTHER): Payer: Managed Care, Other (non HMO) | Admitting: Bariatrics

## 2021-05-14 VITALS — BP 115/81 | HR 68 | Temp 97.9°F | Ht 62.0 in | Wt 212.0 lb

## 2021-05-14 DIAGNOSIS — R632 Polyphagia: Secondary | ICD-10-CM

## 2021-05-14 DIAGNOSIS — Z6841 Body Mass Index (BMI) 40.0 and over, adult: Secondary | ICD-10-CM | POA: Diagnosis not present

## 2021-05-14 DIAGNOSIS — E8881 Metabolic syndrome: Secondary | ICD-10-CM

## 2021-05-14 DIAGNOSIS — E662 Morbid (severe) obesity with alveolar hypoventilation: Secondary | ICD-10-CM

## 2021-05-14 MED ORDER — TIRZEPATIDE 5 MG/0.5ML ~~LOC~~ SOAJ: 5.0000 mg | SUBCUTANEOUS | 0 refills | Status: DC

## 2021-05-14 NOTE — Progress Notes (Signed)
Chief Complaint:   OBESITY Allison Terrell is here to discuss her progress with her obesity treatment plan along with follow-up of her obesity related diagnoses. Allison Terrell is on the Category 3 Plan and states she is following her eating plan approximately 70% of the time. Allison Terrell states she is walking for 30 minutes 2 times per week.  Today's visit was #: 14 Starting weight: 235 lbs Starting date: 09/01/2020 Today's weight: 212 lbs Today's date: 05/14/2021 Total lbs lost to date: 23 lbs Total lbs lost since last in-office visit: 4 lbs  Interim History: Allison Terrell is down 4 lbs since her last visit. She started on Mounjaro injections.  Subjective:   1. Insulin resistance Allison Terrell is currently taking Mounjaro and Metformin. She felt jittery initially but now subsided.   2. Polyphagia Allison Terrell states polyphagia is improving.  Assessment/Plan:   1. Insulin resistance Allison Terrell will continue medications. We will refill Mounjaro 5 mg once weekly with no refills. She will continue to work on weight loss, exercise, and decreasing simple carbohydrates to help decrease the risk of diabetes. Allison Terrell agreed to follow-up with [name] as directed to closely monitor her progress.  - tirzepatide [facility]) 5 MG/0.5ML Pen; Inject 5 mg into the skin once a week.  Dispense: 2 mL; Refill: 0  2. Polyphagia Intensive lifestyle modifications are the first line treatment for this issue. Allison Terrell will continue to decrease carbohydrates. She will continue walking and gradually increase. We discussed several lifestyle modifications today and she will continue to work on diet, exercise and weight loss efforts. Orders and follow up as documented in patient record.  Counseling Polyphagia is excessive hunger. Causes can include: low blood sugars, hypERthyroidism, PMS, lack of sleep, stress, insulin resistance, diabetes, certain medications, and diets that are deficient in protein and fiber.    3. Obesity, current BMI 38.9 Allison Terrell is  currently in the action stage of change. As such, her goal is to continue with weight loss efforts. She has agreed to the Category 3 Plan.   Allison Terrell will continue meal planning. She will adhere closely to the plan (80-90%).  Exercise goals:  As is.  Behavioral modification strategies: increasing lean protein intake, decreasing simple carbohydrates, increasing vegetables, increasing water intake, decreasing eating out, no skipping meals, meal planning and cooking strategies, keeping healthy foods in the home, and planning for success.  Allison Terrell has agreed to follow-up with our clinic in 3-4 weeks. She was informed of the importance of frequent follow-up visits to maximize her success with intensive lifestyle modifications for her multiple health conditions.   Objective:   Blood pressure 115/81, pulse 68, temperature 97.9 F (36.6 C), height 5\' 2"  (1.575 m), weight 212 lb (96.2 kg), SpO2 98 %. Body mass index is 38.78 kg/m.  General: Cooperative, alert, well developed, in no acute distress. HEENT: Conjunctivae and lids unremarkable. Cardiovascular: Regular rhythm.  Lungs: Normal work of breathing. Neurologic: No focal deficits.   Lab Results  Component Value Date   CREATININE 0.94 12/17/2020   BUN 16 12/17/2020   NA 142 12/17/2020   K 4.8 12/17/2020   CL 104 12/17/2020   CO2 25 12/17/2020   Lab Results  Component Value Date   ALT 19 12/17/2020   AST 18 12/17/2020   ALKPHOS 79 12/17/2020   BILITOT 0.5 12/17/2020   Lab Results  Component Value Date   HGBA1C 5.3 04/23/2021   HGBA1C 5.6 12/17/2020   HGBA1C 4.9 09/01/2020   Lab Results  Component Value Date   INSULIN 12.7  04/23/2021   INSULIN 7.8 12/17/2020   INSULIN 11.0 09/01/2020   Lab Results  Component Value Date   TSH 2.670 09/01/2020   Lab Results  Component Value Date   CHOL 173 09/01/2020   HDL 65 09/01/2020   LDLCALC 88 09/01/2020   TRIG 112 09/01/2020   Lab Results  Component Value Date   VD25OH 61.5  04/23/2021   VD25OH 97.9 12/17/2020   VD25OH 47.0 09/01/2020   Lab Results  Component Value Date   WBC 9.3 09/01/2020   HGB 12.9 09/01/2020   HCT 40.0 09/01/2020   MCV 91 09/01/2020   PLT 434 09/01/2020   No results found for: IRON, TIBC, FERRITIN  Attestation Statements:   Reviewed by clinician on day of visit: allergies, medications, problem list, medical history, surgical history, family history, social history, and previous encounter notes.  I, [name], RMA, am acting as Location manager for [employer], [place].   I have reviewed the above documentation for accuracy and completeness, and I agree with the above. Jearld Lesch, DO

## 2021-05-21 ENCOUNTER — Encounter (INDEPENDENT_AMBULATORY_CARE_PROVIDER_SITE_OTHER): Payer: Self-pay

## 2021-06-08 ENCOUNTER — Encounter (INDEPENDENT_AMBULATORY_CARE_PROVIDER_SITE_OTHER): Payer: Self-pay

## 2021-06-11 ENCOUNTER — Ambulatory Visit (INDEPENDENT_AMBULATORY_CARE_PROVIDER_SITE_OTHER): Payer: Managed Care, Other (non HMO) | Admitting: Bariatrics

## 2021-06-11 ENCOUNTER — Other Ambulatory Visit: Payer: Self-pay

## 2021-06-11 VITALS — BP 96/6 | HR 80 | Temp 98.1°F | Ht 62.0 in | Wt 208.0 lb

## 2021-06-11 DIAGNOSIS — R632 Polyphagia: Secondary | ICD-10-CM

## 2021-06-11 DIAGNOSIS — Z6841 Body Mass Index (BMI) 40.0 and over, adult: Secondary | ICD-10-CM

## 2021-06-11 DIAGNOSIS — E662 Morbid (severe) obesity with alveolar hypoventilation: Secondary | ICD-10-CM

## 2021-06-11 DIAGNOSIS — E8881 Metabolic syndrome: Secondary | ICD-10-CM

## 2021-06-11 MED ORDER — TIRZEPATIDE 5 MG/0.5ML ~~LOC~~ SOAJ: 5.0000 mg | SUBCUTANEOUS | 0 refills | Status: DC

## 2021-06-11 NOTE — Progress Notes (Signed)
Chief Complaint:   OBESITY Allison Terrell is here to discuss her progress with her obesity treatment plan along with follow-up of her obesity related diagnoses. Allison Terrell is on the Category 3 Plan and states she is following her eating plan approximately 70% of the time. Allison Terrell states she is counting 8000 steps 3-4 times per week.  Today's visit was #: 15 Starting weight: 235 lbs Starting date: 09/01/2020 Today's weight: 208 lbs Today's date: 06/11/2021 Total lbs lost to date: 27 lbs Total lbs lost since last in-office visit: 4 lbs  Interim History:  Allison Terrell is down another 4 lbs since her last visit and doing well overall. She only struggles with exercise.    Subjective:   1. Insulin resistance Allison Terrell states she is tolerating Mounjaro well.   2. Polyphagia Allison Terrell states Allison Terrell is helping with her appetite.   Assessment/Plan:   1. Insulin resistance Allison Terrell will continue to work on weight loss, exercise, and decreasing simple carbohydrates to help decrease the risk of diabetes. We will refill Mounjaro for 1 month with no refills. Allison Terrell agreed to follow-up with [name] as directed to closely monitor her progress.  - tirzepatide [facility]) 5 MG/0.5ML Pen; Inject 5 mg into the skin once a week.  Dispense: 2 mL; Refill: 0  2. Polyphagia Intensive lifestyle modifications are the first line treatment for this issue. Allison Terrell will decrease carbohydrates. She will increase water and protein. We discussed several lifestyle modifications today and she will continue to work on diet, exercise and weight loss efforts. Orders and follow up as documented in patient record.  Counseling Polyphagia is excessive hunger. Causes can include: low blood sugars, hypERthyroidism, PMS, lack of sleep, stress, insulin resistance, diabetes, certain medications, and diets that are deficient in protein and fiber.    3. Obesity, current BMI 38.0 Allison Terrell is currently in the action stage of change. As such, her goal is to  continue with weight loss efforts. She has agreed to the Category 3 Plan.   Allison Terrell will continue meal planning and intentional eating. Strategies for holiday eating was provided today.   Exercise goals: Allison Terrell will continue using Fitbit to get her step in.   Behavioral modification strategies: increasing lean protein intake, decreasing simple carbohydrates, increasing vegetables, increasing water intake, decreasing eating out, no skipping meals, meal planning and cooking strategies, keeping healthy foods in the home, and planning for success.  Allison Terrell has agreed to follow-up with our clinic in 3 weeks at the end of the month. She was informed of the importance of frequent follow-up visits to maximize her success with intensive lifestyle modifications for her multiple health conditions.   Objective:   Blood pressure (!) 96/6, pulse 80, temperature 98.1 F (36.7 C), height 5\' 2"  (1.575 m), weight 208 lb (94.3 kg), SpO2 98 %. Body mass index is 38.04 kg/m.  General: Cooperative, alert, well developed, in no acute distress. HEENT: Conjunctivae and lids unremarkable. Cardiovascular: Regular rhythm.  Lungs: Normal work of breathing. Neurologic: No focal deficits.   Lab Results  Component Value Date   CREATININE 0.94 12/17/2020   BUN 16 12/17/2020   NA 142 12/17/2020   K 4.8 12/17/2020   CL 104 12/17/2020   CO2 25 12/17/2020   Lab Results  Component Value Date   ALT 19 12/17/2020   AST 18 12/17/2020   ALKPHOS 79 12/17/2020   BILITOT 0.5 12/17/2020   Lab Results  Component Value Date   HGBA1C 5.3 04/23/2021   HGBA1C 5.6 12/17/2020   HGBA1C  4.9 09/01/2020   Lab Results  Component Value Date   INSULIN 12.7 04/23/2021   INSULIN 7.8 12/17/2020   INSULIN 11.0 09/01/2020   Lab Results  Component Value Date   TSH 2.670 09/01/2020   Lab Results  Component Value Date   CHOL 173 09/01/2020   HDL 65 09/01/2020   LDLCALC 88 09/01/2020   TRIG 112 09/01/2020   Lab Results   Component Value Date   VD25OH 61.5 04/23/2021   VD25OH 97.9 12/17/2020   VD25OH 47.0 09/01/2020   Lab Results  Component Value Date   WBC 9.3 09/01/2020   HGB 12.9 09/01/2020   HCT 40.0 09/01/2020   MCV 91 09/01/2020   PLT 434 09/01/2020   No results found for: IRON, TIBC, FERRITIN  Attestation Statements:   Reviewed by clinician on day of visit: allergies, medications, problem list, medical history, surgical history, family history, social history, and previous encounter notes.  I, [name], RMA, am acting as Location manager for [employer], [place].   I have reviewed the above documentation for accuracy and completeness, and I agree with the above. Jearld Lesch, DO

## 2021-06-15 ENCOUNTER — Encounter (INDEPENDENT_AMBULATORY_CARE_PROVIDER_SITE_OTHER): Payer: Self-pay | Admitting: Bariatrics

## 2021-06-22 ENCOUNTER — Telehealth (INDEPENDENT_AMBULATORY_CARE_PROVIDER_SITE_OTHER): Payer: Self-pay | Admitting: Bariatrics

## 2021-06-22 ENCOUNTER — Encounter (INDEPENDENT_AMBULATORY_CARE_PROVIDER_SITE_OTHER): Payer: Self-pay

## 2021-06-22 NOTE — Telephone Encounter (Signed)
Prior authorization denied for Mounjaro. Patient sent mychart message.  

## 2021-06-29 ENCOUNTER — Other Ambulatory Visit: Payer: Self-pay

## 2021-06-29 ENCOUNTER — Ambulatory Visit (INDEPENDENT_AMBULATORY_CARE_PROVIDER_SITE_OTHER): Payer: Managed Care, Other (non HMO) | Admitting: Bariatrics

## 2021-06-29 ENCOUNTER — Encounter (INDEPENDENT_AMBULATORY_CARE_PROVIDER_SITE_OTHER): Payer: Self-pay | Admitting: Bariatrics

## 2021-06-29 VITALS — BP 109/74 | HR 86 | Temp 98.3°F | Ht 62.0 in | Wt 206.0 lb

## 2021-06-29 DIAGNOSIS — E662 Morbid (severe) obesity with alveolar hypoventilation: Secondary | ICD-10-CM | POA: Diagnosis not present

## 2021-06-29 DIAGNOSIS — E8881 Metabolic syndrome: Secondary | ICD-10-CM

## 2021-06-29 DIAGNOSIS — R632 Polyphagia: Secondary | ICD-10-CM

## 2021-06-29 DIAGNOSIS — Z6841 Body Mass Index (BMI) 40.0 and over, adult: Secondary | ICD-10-CM

## 2021-06-29 MED ORDER — TIRZEPATIDE 5 MG/0.5ML ~~LOC~~ SOAJ: 5.0000 mg | SUBCUTANEOUS | 0 refills | Status: AC

## 2021-06-30 ENCOUNTER — Encounter (INDEPENDENT_AMBULATORY_CARE_PROVIDER_SITE_OTHER): Payer: Self-pay | Admitting: Bariatrics

## 2021-06-30 NOTE — Progress Notes (Signed)
Chief Complaint:   OBESITY Allison Terrell is here to discuss her progress with her obesity treatment plan along with follow-up of her obesity related diagnoses. Allison Terrell is on the Category 3 Plan and states she is following her eating plan approximately 70% of the time. Allison Terrell states she is walking and doing squats for 35 minutes 3 times per week.  Today's visit was #: 18 Starting weight: 235 lbs Starting date: 09/01/2020 Today's weight: 206 lbs Today's date: 06/29/2021 Total lbs lost to date: 29 lbs Total lbs lost since last in-office visit: 2 lbs  Interim History: Allison Terrell is down an additional 2 lbs since the last visit and doing well overall. She is doing well with her water and protein intake.  Subjective:   1. Insulin resistance Allison Terrell is taking her medications as directed. She denies side effects.  2. Allison Terrell will cut back on carbohydrates. Her polyphagia is controlled.  Assessment/Plan:   1. Insulin resistance Allison Terrell will continue to work on weight loss, exercise, and decreasing simple carbohydrates to help decrease the risk of diabetes. We will refill Mounjaro 5 mg with no refills.Allison Terrell agreed to follow-up with [name] as directed to closely monitor her progress.  - tirzepatide [facility]) 5 MG/0.5ML Pen; Inject 5 mg into the skin once a week.  Dispense: 2 mL; Refill: 0  2. Polyphagia Intensive lifestyle modifications are the first line treatment for this issue. We discussed several lifestyle modifications today and she will continue to work on diet, exercise and weight loss efforts. Allison Terrell will continue to decrease starches and sweets. She will increase healthy protein and fats. Orders and follow up as documented in patient record.  Counseling Polyphagia is excessive hunger. Causes can include: low blood sugars, hypERthyroidism, PMS, lack of sleep, stress, insulin resistance, diabetes, certain medications, and diets that are deficient in protein and fiber.   3. Obesity,  current BMI 37.7 Allison Terrell is currently in the action stage of change. As such, her goal is to continue with weight loss efforts. She has agreed to the Category 3 Plan.   Allison Terrell will continue meal planning and she will continue intentional eating.  Exercise goals:  As is.  Behavioral modification strategies: increasing lean protein intake, decreasing simple carbohydrates, increasing vegetables, increasing water intake, decreasing eating out, no skipping meals, meal planning and cooking strategies, keeping healthy foods in the home, and planning for success.  Allison Terrell has agreed to follow-up with our clinic in 4 weeks. She was informed of the importance of frequent follow-up visits to maximize her success with intensive lifestyle modifications for her multiple health conditions.   Objective:   Blood pressure 109/74, pulse 86, temperature 98.3 F (36.8 C), height 5\' 2"  (1.575 m), weight 206 lb (93.4 kg), SpO2 97 %. Body mass index is 37.68 kg/m.  General: Cooperative, alert, well developed, in no acute distress. HEENT: Conjunctivae and lids unremarkable. Cardiovascular: Regular rhythm.  Lungs: Normal work of breathing. Neurologic: No focal deficits.   Lab Results  Component Value Date   CREATININE 0.94 12/17/2020   BUN 16 12/17/2020   NA 142 12/17/2020   K 4.8 12/17/2020   CL 104 12/17/2020   CO2 25 12/17/2020   Lab Results  Component Value Date   ALT 19 12/17/2020   AST 18 12/17/2020   ALKPHOS 79 12/17/2020   BILITOT 0.5 12/17/2020   Lab Results  Component Value Date   HGBA1C 5.3 04/23/2021   HGBA1C 5.6 12/17/2020   HGBA1C 4.9 09/01/2020   Lab Results  Component Value Date   INSULIN 12.7 04/23/2021   INSULIN 7.8 12/17/2020   INSULIN 11.0 09/01/2020   Lab Results  Component Value Date   TSH 2.670 09/01/2020   Lab Results  Component Value Date   CHOL 173 09/01/2020   HDL 65 09/01/2020   LDLCALC 88 09/01/2020   TRIG 112 09/01/2020   Lab Results  Component Value  Date   VD25OH 61.5 04/23/2021   VD25OH 97.9 12/17/2020   VD25OH 47.0 09/01/2020   Lab Results  Component Value Date   WBC 9.3 09/01/2020   HGB 12.9 09/01/2020   HCT 40.0 09/01/2020   MCV 91 09/01/2020   PLT 434 09/01/2020   No results found for: IRON, TIBC, FERRITIN  Attestation Statements:   Reviewed by clinician on day of visit: allergies, medications, problem list, medical history, surgical history, family history, social history, and previous encounter notes.  I, [name], RMA, am acting as Location manager for [employer], DO.   I have reviewed the above documentation for accuracy and completeness, and I agree with the above. Jearld Lesch, DO

## 2021-07-16 NOTE — Progress Notes (Signed)
[facility] [address] [place]. [place] [address] [phone]       OFFICE FOLLOW UP NOTE  [name] Date of Birth:  [birth year] Medical Record Number:  [id number]   Primary neurologist: Dr. Rexene Alberts Reason for visit: CPAP compliance visit   Virtual Visit via Video Note  Virtual visit completed through [place], a video enabled telemedicine application. Due to national recommendations of social distancing due to COVID-19, a virtual visit is felt to be most appropriate for this patient at this time. Reviewed limitations, risks, security and privacy concerns of performing a virtual visit and the availability of in person appointments. I also reviewed that there may be a patient responsible charge related to this service. The patient agreed to proceed.    Patient location: home Provider location: in office, [facility] Persons participating in this virtual visit: patient and provider       SUBJECTIVE:   HPI:   Update 07/20/2021 JM: Returns for yearly CPAP compliance visit via MyChart video visit.  Review of compliance report as below.  Excellent compliance at 100% with optimal residual AHI 1.9. she continues to tolerate mask and CPAP without difficulty. She does report continued difficulties with current DME company ([place]) in regards to receiving supplies as well as insurance coverage concerns. She is interested in changing DME companies.  No further concerns at this time.           History provided for reference purposes only Update 07/15/2020 JM: [name] returns for CPAP follow-up.  Reports improved tolerance with current CPAP mask.  Prior difficulty tolerating in place order for mask refitting but apparently she has had multiple insurance difficulties working with DME [employer] [facility] [employer].  ESS 11 (prior 14) and FSS 35 (prior 38).  Compliance report from 06/14/2020 -07/13/2020 shows 26 out of 30 usage days with 26  days greater than 4 hours for 87% compliance.  Average usage 8 hours and 5 minutes.  Residual AHI 2.3 with current settings min pressure 6 and max pressure 12 with a EPR level 3.  Pressure in the 95th percentile 11.8.  Leaks in the 95 percentile 25.0.  Update 12/27/2019 JM: Allison Terrell is a 50 year old female with underlying history of chronic low back pain, chronic kidney disease, depression and obesity who was recently evaluated by Dr. Rexene Alberts for possible sleep apnea due to complaints of snoring, excessive daytime somnolence and witnessed apneas. Underwent home sleep test on 09/12/2019 which showed mild obstructive sleep apnea with total AHI of 13.6/h and O2 nadir of 87%.  Recommended initiation of AutoPap titration/trial at home for now.  Review of compliance report from 11/27/2019 -12/26/2019 shows 27 out of 30 usage days with 27 days greater than 4 hours for 90% compliance.  Average usage 7 hours and 45 minutes.  Residual AHI 2.7.  Leaks in the 95th percentile 30.3.  Pressure in the 95th percentile 10.7 onset pressure 6 and max pressure 12 with EPR level 3.  She initially was tolerating well but over the past week has been having greater difficulty with sealing of the mask and leaking.  She is wondering if she needs a new mask.  She has not followed up with DME company [employer].  Otherwise, she is tolerating well and feels as though she is sleeping better throughout the night.  Epworth sleepiness score also improved currently 9/24 where previously 14/24 and fatigue severity scale currently 38/63 previously 45/63.  No concerns at this time.  ROS:   14 system review of systems performed and negative with exception of no complaints     PMH:  Past Medical History:  Diagnosis Date   Anemia    Arthritis    Back pain    Chronic kidney disease    Constipation    Depression    Depression    Edema of both lower extremities    Fatty tumor    GERD (gastroesophageal reflux disease)    IBS  (irritable bowel syndrome)    Joint pain    Kidney problem    Osteoarthritis    Sleep apnea     PSH:  Past Surgical History:  Procedure Laterality Date   ABLATION     CESAREAN SECTION     ENDOMETRIAL ABLATION      Social History:  Social History   Socioeconomic History   Marital status: Divorced    Spouse name: Not on file   Number of children: Not on file   Years of education: Not on file   Highest education level: Not on file  Occupational History   Occupation: CSR/Compliance  Tobacco Use   Smoking status: Former   Smokeless tobacco: Never  Substance and Sexual Activity   Alcohol use: Yes    Comment: rare   Drug use: No   Sexual activity: Not on file  Other Topics Concern   Not on file  Social History Narrative   Not on file   Social Determinants of Health   Financial Resource Strain: Not on file  Food Insecurity: Not on file  Transportation Needs: Not on file  Physical Activity: Not on file  Stress: Not on file  Social Connections: Not on file  Intimate Partner Violence: Not on file    Family History:  Family History  Problem Relation Age of Onset   Lung cancer Mother    Heart disease Mother    Diabetes Mother    Depression Mother    Obesity Mother    Lung cancer Father    Heart disease Father    Alcoholism Father    Drug abuse Father    Asthma Sister     Medications:   Current Outpatient Medications on File Prior to Visit  Medication Sig Dispense Refill   acetaminophen (TYLENOL) 325 MG tablet Take 650 mg by mouth every 6 (six) hours as needed.     buPROPion (WELLBUTRIN XL) 150 MG 24 hr tablet 150 mg. 300 mg qd     Probiotic Product (PROBIOTIC-10 ULTIMATE PO) Take by mouth. qd     tirzepatide (MOUNJARO) 5 MG/0.5ML Pen Inject 5 mg into the skin once a week. 2 mL 0   No current facility-administered medications on file prior to visit.    Allergies:  No Known Allergies    OBJECTIVE: N/A d/t visit type     ASSESSMENT/PLAN: Allison Terrell is a 50 y.o. year old female evaluated by Dr. Rexene Alberts on 06/18/2019 for possible sleep apnea and underwent HST on 09/12/2019 which showed mild obstructive sleep apnea and recommended initiation of AutoPap.    Satisfactory compliance with optimal residual AHI.   Continue current settings with min pressure 6 and max pressure 12 with a EPR level 3.   Will request change of DME companies (similar concerns over the past year re: adequately getting supplies and difficulty with insurance coverage)    Follow-up in 1 year or call earlier if needed   CC:  Deland Pretty, MD    I spent 20  minutes of face-to-face and non-face-to-face time with patient.  This included previsit chart review, order entry, electronic health record documentation, patient education and discussion regarding ongoing use of CPAP, reviewing discussion of compliance report, DME and insurance company concerns and answered all other questions to patient satisfaction  Frann Rider, [facility]  [facility] [address] [place], [place] [address]  Phone [phone] Fax [phone] Note: This document was prepared with digital dictation and possible smart phrase technology. Any transcriptional errors that result from this process are unintentional.

## 2021-07-20 ENCOUNTER — Telehealth (INDEPENDENT_AMBULATORY_CARE_PROVIDER_SITE_OTHER): Payer: Managed Care, Other (non HMO) | Admitting: Adult Health

## 2021-07-20 ENCOUNTER — Encounter: Payer: Self-pay | Admitting: Adult Health

## 2021-07-20 DIAGNOSIS — G4733 Obstructive sleep apnea (adult) (pediatric): Secondary | ICD-10-CM | POA: Diagnosis not present

## 2021-07-20 DIAGNOSIS — Z9989 Dependence on other enabling machines and devices: Secondary | ICD-10-CM

## 2021-07-20 NOTE — Progress Notes (Signed)
I have sent the transfer of care order to [facility] Fax # [phone], confirmation has been received.

## 2021-07-23 ENCOUNTER — Other Ambulatory Visit: Payer: Self-pay | Admitting: Obstetrics and Gynecology

## 2021-07-23 DIAGNOSIS — R928 Other abnormal and inconclusive findings on diagnostic imaging of breast: Secondary | ICD-10-CM

## 2021-07-28 ENCOUNTER — Ambulatory Visit (INDEPENDENT_AMBULATORY_CARE_PROVIDER_SITE_OTHER): Payer: Managed Care, Other (non HMO) | Admitting: Bariatrics

## 2021-07-28 ENCOUNTER — Telehealth (INDEPENDENT_AMBULATORY_CARE_PROVIDER_SITE_OTHER): Payer: Self-pay | Admitting: Bariatrics

## 2021-07-28 ENCOUNTER — Encounter (INDEPENDENT_AMBULATORY_CARE_PROVIDER_SITE_OTHER): Payer: Self-pay

## 2021-07-28 NOTE — Telephone Encounter (Signed)
Prior authorization denied for [facility]. Patient already uses coupon savings card, pt sent mychart message.

## 2021-07-28 NOTE — Progress Notes (Signed)
Received CPAP orders from [facility], signed and faxed back. Received confirmation.

## 2021-08-18 ENCOUNTER — Other Ambulatory Visit (INDEPENDENT_AMBULATORY_CARE_PROVIDER_SITE_OTHER): Payer: Self-pay | Admitting: Bariatrics

## 2021-08-18 DIAGNOSIS — E8881 Metabolic syndrome: Secondary | ICD-10-CM

## 2021-08-27 ENCOUNTER — Other Ambulatory Visit: Payer: Self-pay

## 2021-08-27 ENCOUNTER — Ambulatory Visit
Admission: RE | Admit: 2021-08-27 | Discharge: 2021-08-27 | Disposition: A | Discharge status: Home / Self Care | Payer: Self-pay | Source: Ambulatory Visit | Attending: Obstetrics and Gynecology | Admitting: Obstetrics and Gynecology

## 2021-08-27 ENCOUNTER — Ambulatory Visit: Payer: Self-pay

## 2021-08-27 DIAGNOSIS — R928 Other abnormal and inconclusive findings on diagnostic imaging of breast: Secondary | ICD-10-CM

## 2022-03-17 ENCOUNTER — Encounter (INDEPENDENT_AMBULATORY_CARE_PROVIDER_SITE_OTHER): Payer: Self-pay

## 2022-07-15 NOTE — Progress Notes (Signed)
[facility] [address] [place]. [place] [address] [phone]       OFFICE FOLLOW UP NOTE  Allison Terrell Date of Birth:  06-17-1971 Medical Record Number:  [id number]   Primary neurologist: Dr. Rexene Alberts Reason for visit: CPAP compliance visit   Virtual Visit via Video Note  Virtual visit completed through [place], a video enabled telemedicine application. Due to national recommendations of social distancing due to COVID-19, a virtual visit is felt to be most appropriate for this patient at this time. Reviewed limitations, risks, security and privacy concerns of performing a virtual visit and the availability of in person appointments. I also reviewed that there may be a patient responsible charge related to this service. The patient agreed to proceed.    Patient location: home Provider location: in office, [facility] Persons participating in this virtual visit: patient and provider       SUBJECTIVE:   HPI:   Update 07/19/2022 JM: Patient returns for CPAP follow-up visit via [place] video visit.  Review of DL report as below showing adequate usage and optimal residual AHI.  Continues to tolerate CPAP well.  Sleeping well throughout the night and adequate daytime energy levels. She switched DME companies, currently using [facility], has not been having any issues with insurance since switching.         History provided for reference purposes only Update 07/20/2021 JM: Returns for yearly CPAP compliance visit via MyChart video visit.  Review of compliance report as below.  Excellent compliance at 100% with optimal residual AHI 1.9. she continues to tolerate mask and CPAP without difficulty. She does report continued difficulties with current DME company ([employer]) in regards to receiving supplies as well as insurance coverage concerns. She is interested in changing DME companies.  No further concerns at this time.      Update  07/15/2020 JM: [name] returns for CPAP follow-up.  Reports improved tolerance with current CPAP mask.  Prior difficulty tolerating in place order for mask refitting but apparently she has had multiple insurance difficulties working with DME [employer] [facility] [employer].  ESS 11 (prior 14) and FSS 35 (prior 38).  Compliance report from 06/14/2020 -07/13/2020 shows 26 out of 30 usage days with 26 days greater than 4 hours for 87% compliance.  Average usage 8 hours and 5 minutes.  Residual AHI 2.3 with current settings min pressure 6 and max pressure 12 with a EPR level 3.  Pressure in the 95th percentile 11.8.  Leaks in the 95 percentile 25.0.  Update 12/27/2019 JM: Allison Terrell is a 51 year old female with underlying history of chronic low back pain, chronic kidney disease, depression and obesity who was recently evaluated by Dr. Rexene Alberts for possible sleep apnea due to complaints of snoring, excessive daytime somnolence and witnessed apneas. Underwent home sleep test on 09/12/2019 which showed mild obstructive sleep apnea with total AHI of 13.6/h and O2 nadir of 87%.  Recommended initiation of AutoPap titration/trial at home for now.  Review of compliance report from 11/27/2019 -12/26/2019 shows 27 out of 30 usage days with 27 days greater than 4 hours for 90% compliance.  Average usage 7 hours and 45 minutes.  Residual AHI 2.7.  Leaks in the 95th percentile 30.3.  Pressure in the 95th percentile 10.7 onset pressure 6 and max pressure 12 with EPR level 3.  She initially was tolerating well but over the past week has been having greater difficulty with sealing of the mask and leaking.  She is wondering if she needs a new mask.  She has not followed up with DME company [facility] [facility].  Otherwise, she is tolerating well and feels as though she is sleeping better throughout the night.  Epworth sleepiness score also improved currently 9/24 where previously 14/24 and fatigue severity scale currently 38/63 previously 45/63.  No  concerns at this time.      ROS:   14 system review of systems performed and negative with exception of no complaints     PMH:  Past Medical History:  Diagnosis Date   Anemia    Arthritis    Back pain    Chronic kidney disease    Constipation    Depression    Depression    Edema of both lower extremities    Fatty tumor    GERD (gastroesophageal reflux disease)    IBS (irritable bowel syndrome)    Joint pain    Kidney problem    Osteoarthritis    Sleep apnea     PSH:  Past Surgical History:  Procedure Laterality Date   ABLATION     CESAREAN SECTION     ENDOMETRIAL ABLATION      Social History:  Social History   Socioeconomic History   Marital status: Divorced    Spouse name: Not on file   Number of children: Not on file   Years of education: Not on file   Highest education level: Not on file  Occupational History   Occupation: CSR/Compliance  Tobacco Use   Smoking status: Former   Smokeless tobacco: Never  Substance and Sexual Activity   Alcohol use: Yes    Comment: rare   Drug use: No   Sexual activity: Not on file  Other Topics Concern   Not on file  Social History Narrative   Not on file   Social Determinants of Health   Financial Resource Strain: Not on file  Food Insecurity: Not on file  Transportation Needs: Not on file  Physical Activity: Not on file  Stress: Not on file  Social Connections: Not on file  Intimate Partner Violence: Not on file    Family History:  Family History  Problem Relation Age of Onset   Lung cancer Mother    Heart disease Mother    Diabetes Mother    Depression Mother    Obesity Mother    Lung cancer Father    Heart disease Father    Alcoholism Father    Drug abuse Father    Breast cancer Sister 70   Asthma Sister     Medications:   Current Outpatient Medications on File Prior to Visit  Medication Sig Dispense Refill   acetaminophen (TYLENOL) 325 MG tablet Take 650 mg by mouth every 6 (six)  hours as needed.     buPROPion (WELLBUTRIN XL) 150 MG 24 hr tablet 150 mg. 300 mg qd     Probiotic Product (PROBIOTIC-10 ULTIMATE PO) Take by mouth. qd     tirzepatide (MOUNJARO) 5 MG/0.5ML Pen Inject 5 mg into the skin once a week. 2 mL 0   No current facility-administered medications on file prior to visit.    Allergies:  No Known Allergies    OBJECTIVE: N/A d/t visit type      ASSESSMENT/PLAN: Allison Terrell is a 51 y.o. year old female evaluated by Dr. Rexene Alberts on 06/18/2019 for possible sleep apnea and underwent HST on 09/12/2019 which showed mild obstructive sleep apnea and recommended initiation of AutoPap.  Satisfactory compliance with optimal residual AHI.   Continue current settings with min pressure 6 and max pressure 12 with a EPR level 3.   Continue to follow with DME company [facility] for any needed supplies or CPAP related concerns    Follow-up in 1 year or call earlier if needed   CC:  Deland Pretty, MD    I spent 15 minutes of face-to-face and non-face-to-face time with patient via [place] video visit.  This included previsit chart review, order entry, electronic health record documentation, patient education and discussion regarding ongoing use of CPAP, reviewing discussion of compliance report, and answered all other questions to patient satisfaction  Frann Rider, [facility]  [facility] [address] [place], [place] [address]  Phone [phone] Fax [phone] Note: This document was prepared with digital dictation and possible smart phrase technology. Any transcriptional errors that result from this process are unintentional.

## 2022-07-19 ENCOUNTER — Telehealth (INDEPENDENT_AMBULATORY_CARE_PROVIDER_SITE_OTHER): Payer: Managed Care, Other (non HMO) | Admitting: Adult Health

## 2022-07-19 ENCOUNTER — Encounter: Payer: Self-pay | Admitting: Adult Health

## 2022-07-19 DIAGNOSIS — G4733 Obstructive sleep apnea (adult) (pediatric): Secondary | ICD-10-CM

## 2023-05-19 IMAGING — US US BREAST*L* LIMITED INC AXILLA
1 series · 8 of 8 positions shown · non-contrast
Comparison: Previous exam(s).

CLINICAL DATA: Bilateral screening recall for a right breast
asymmetry and a possible left breast mass.

EXAM:
DIGITAL DIAGNOSTIC BILATERAL MAMMOGRAM WITH TOMOSYNTHESIS AND CAD;
ULTRASOUND LEFT BREAST LIMITED
TECHNIQUE: Bilateral digital diagnostic mammography and breast tomosynthesis
was performed. The images were evaluated with computer-aided
detection.; Targeted ultrasound examination of the left breast was
performed.

[Series 1: us breast*left* limited inc axilla · 0.06mm/px · 8 of 8 slices shown]
[im 1/8]
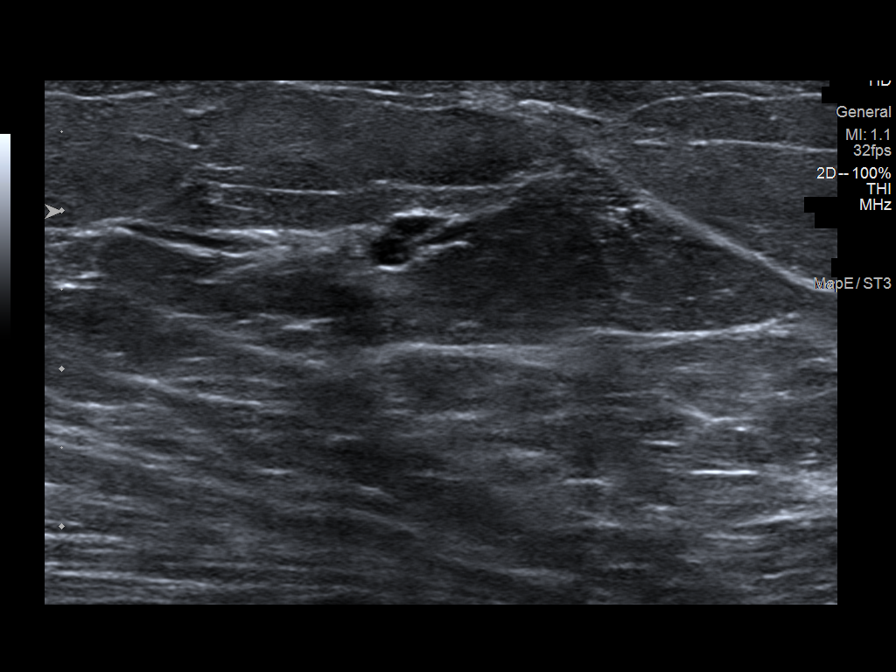
[im 2/8]
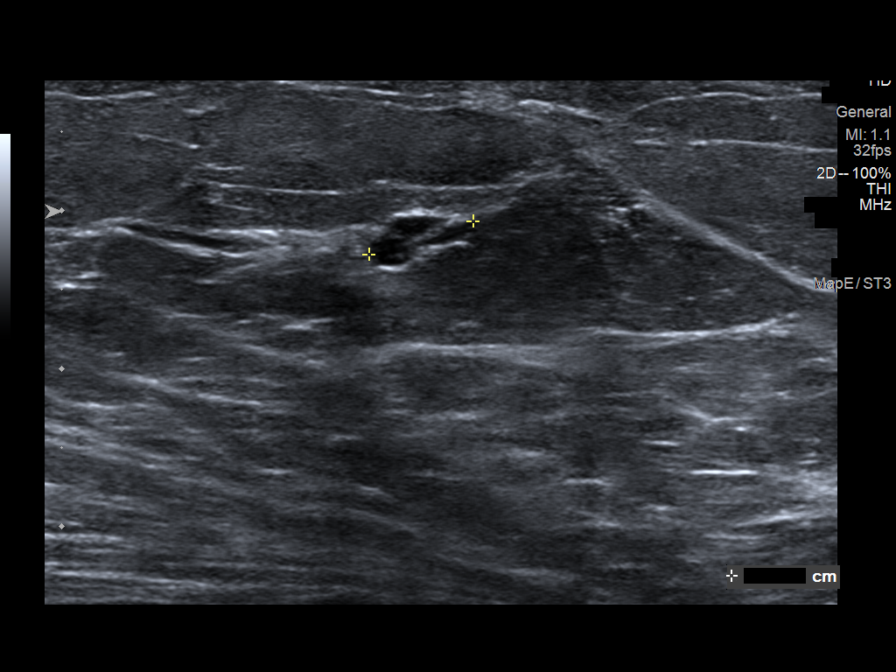
[im 3/8]
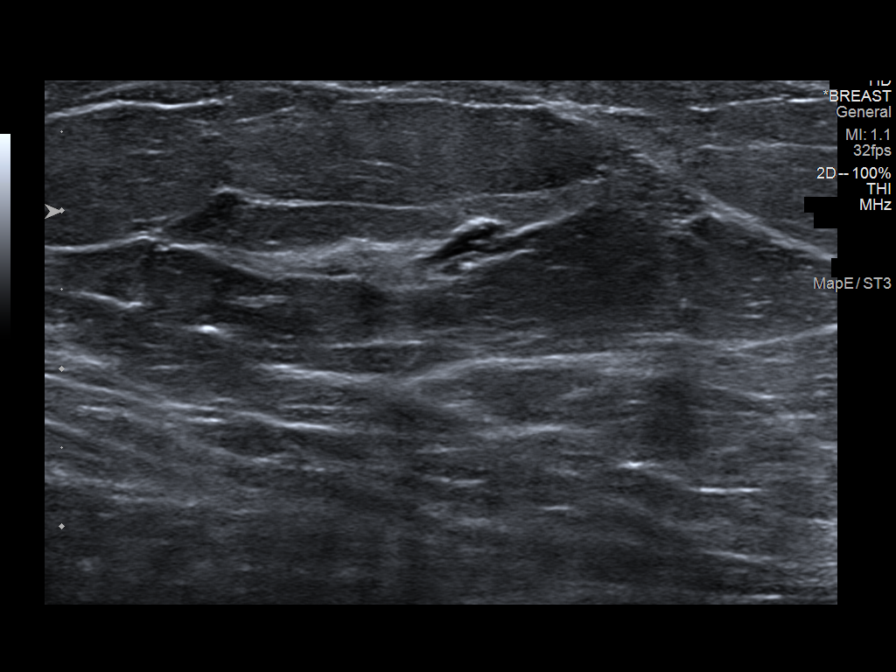
[im 4/8]
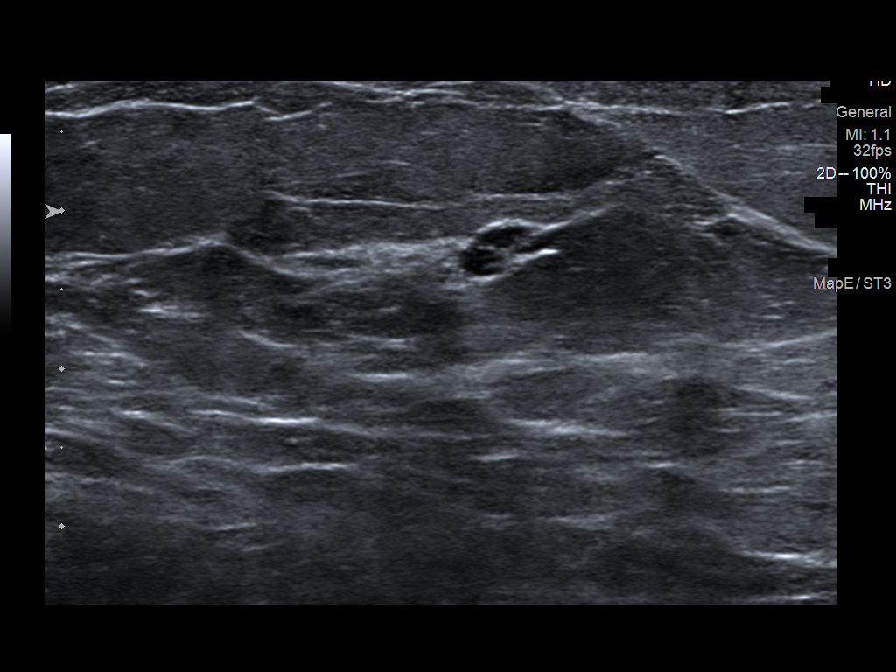
[im 5/8]
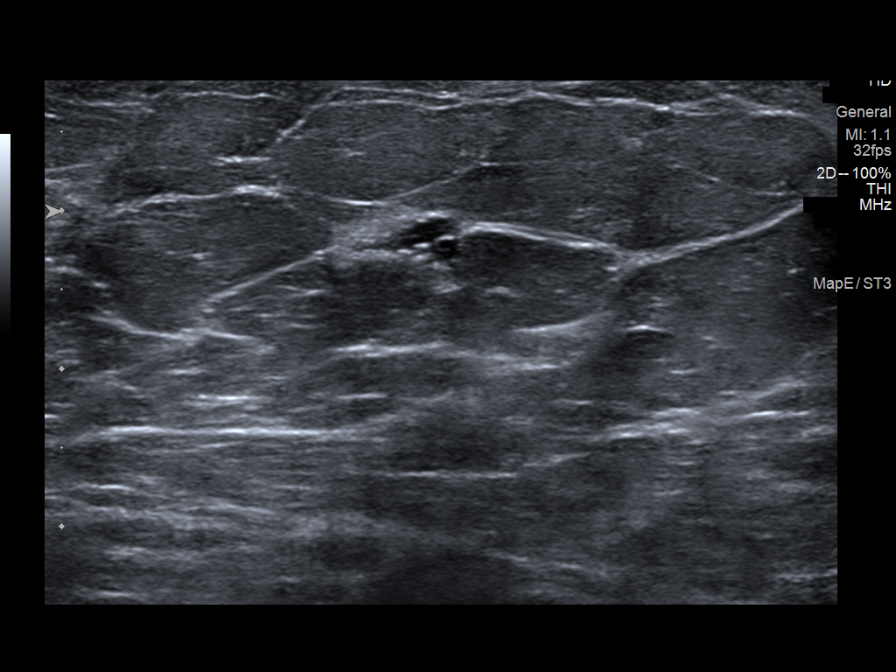
[im 6/8]
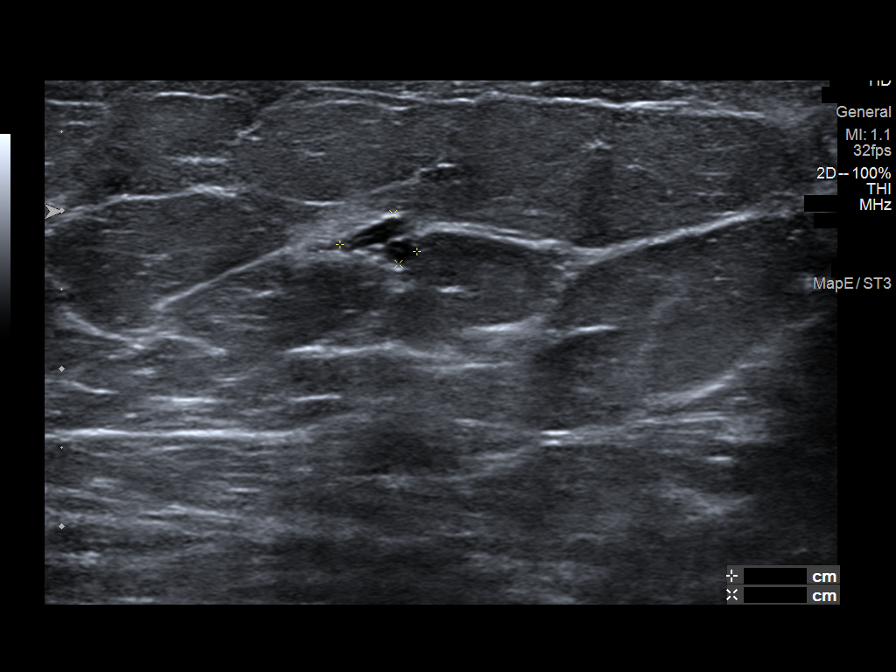
[im 7/8]
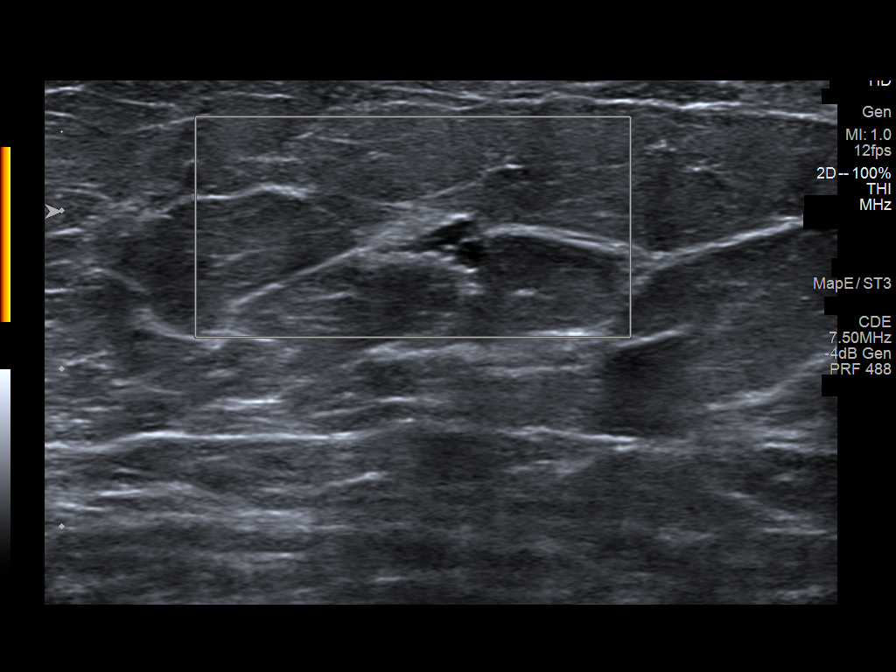
[im 8/8]
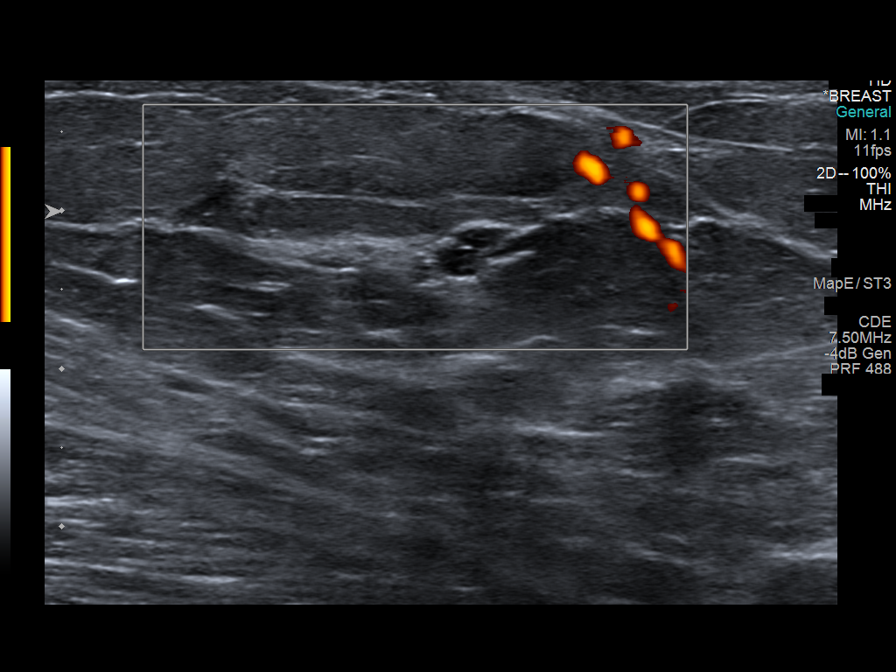

[8 of 8 positions shown; findings below may reference images not displayed]

ACR Breast Density Category c: The breast tissue is heterogeneously
dense, which may obscure small masses.
FINDINGS: Spot compression tomosynthesis images through the upper outer right
breast demonstrates resolution of the asymmetry of concern.

Paddle tomosynthesis images in the lateral projection demonstrates a
persistent superficial mass/cluster of masses in the middle to
posterior depth of the left breast.

Ultrasound targeted to the left breast at 1 o'clock, 8 cm from the
nipple demonstrates a cluster of anechoic oval masses together
spanning 7 x 3 x 5 mm.
IMPRESSION: 1. Resolution of the right breast asymmetry consistent overlapping
fibroglandular tissue.

2. The mass in the superior left breast corresponds with a cluster
of benign cysts.

RECOMMENDATION:
Screening mammogram in one year.(Code:YA-H-PH8)

I have discussed the findings and recommendations with the patient.
If applicable, a reminder letter will be sent to the patient
regarding the next appointment.

BI-RADS CATEGORY  2: Benign.

## 2023-05-19 IMAGING — MG DIGITAL DIAGNOSTIC BILAT W/ TOMO W/ CAD
6 of 10 series · 6 of 30 positions shown · non-contrast
Comparison: Previous exam(s).

CLINICAL DATA: Bilateral screening recall for a right breast
asymmetry and a possible left breast mass.

EXAM:
DIGITAL DIAGNOSTIC BILATERAL MAMMOGRAM WITH TOMOSYNTHESIS AND CAD;
ULTRASOUND LEFT BREAST LIMITED
TECHNIQUE: Bilateral digital diagnostic mammography and breast tomosynthesis
was performed. The images were evaluated with computer-aided
detection.; Targeted ultrasound examination of the left breast was
performed.

[L ML synth-2D]
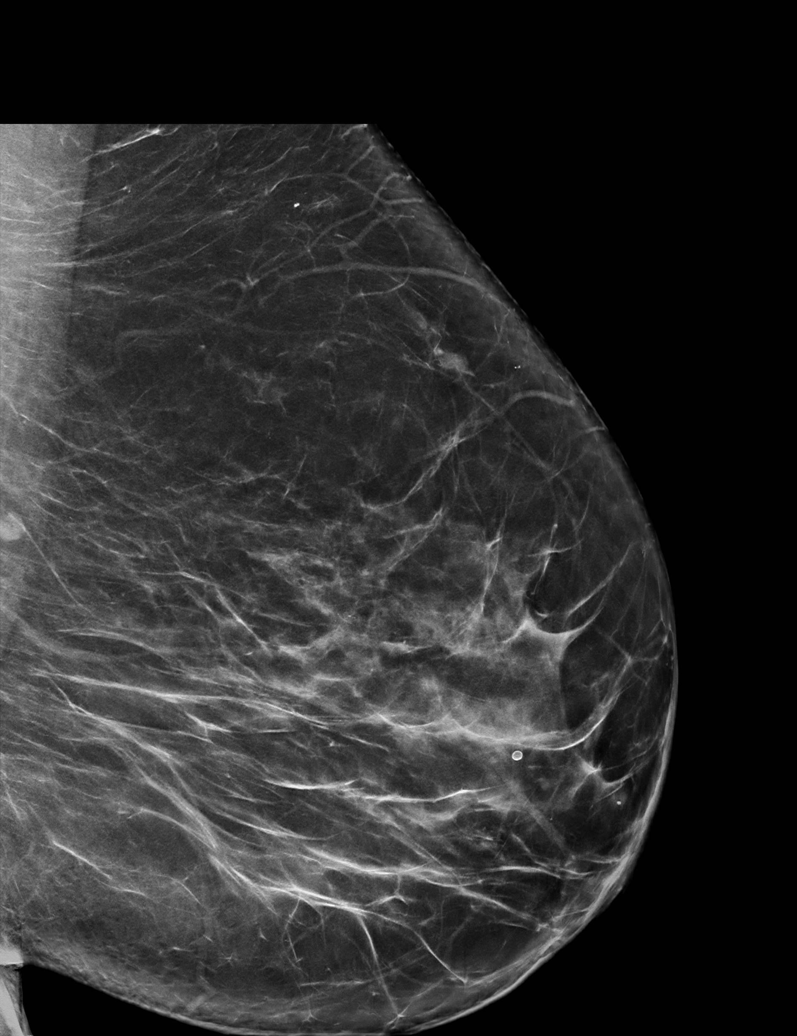

[L MLO synth-2D (1 of 2)]
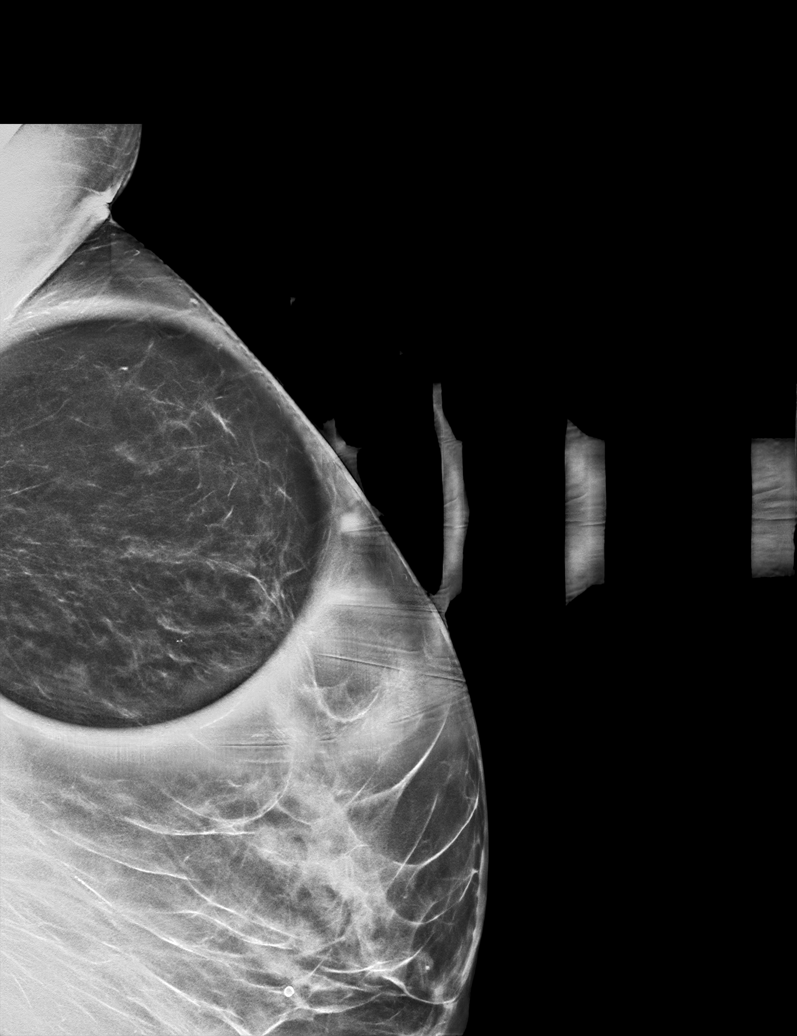

[R MLO synth-2D]
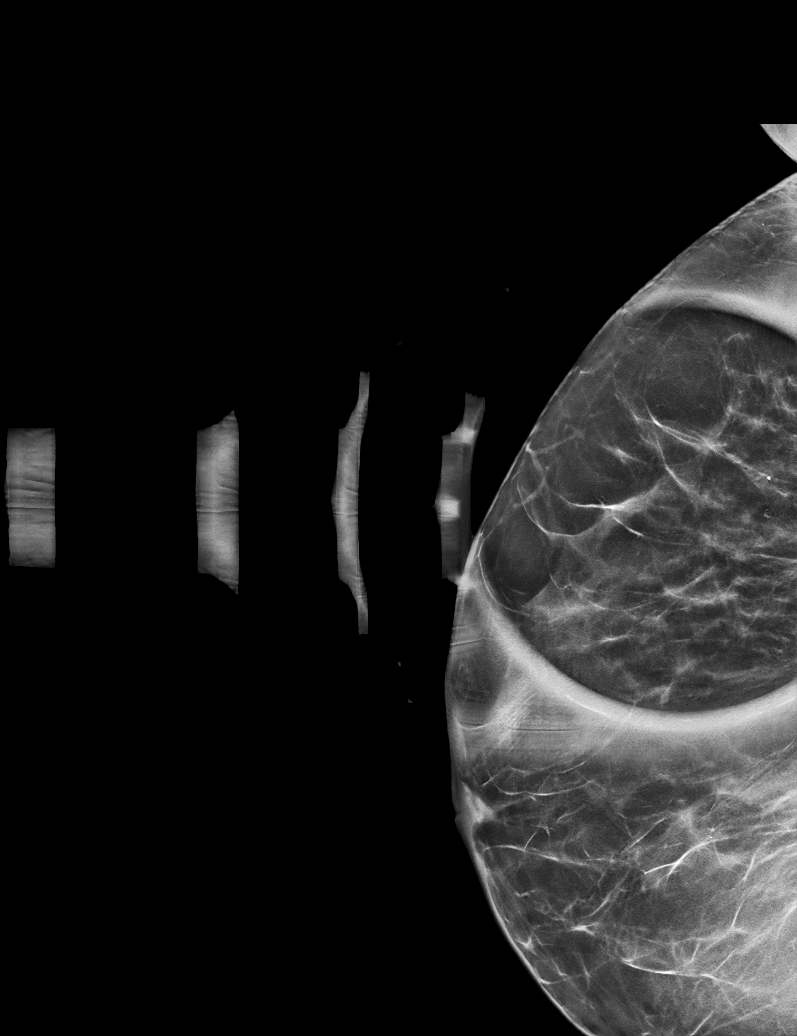

[R CC synth-2D]
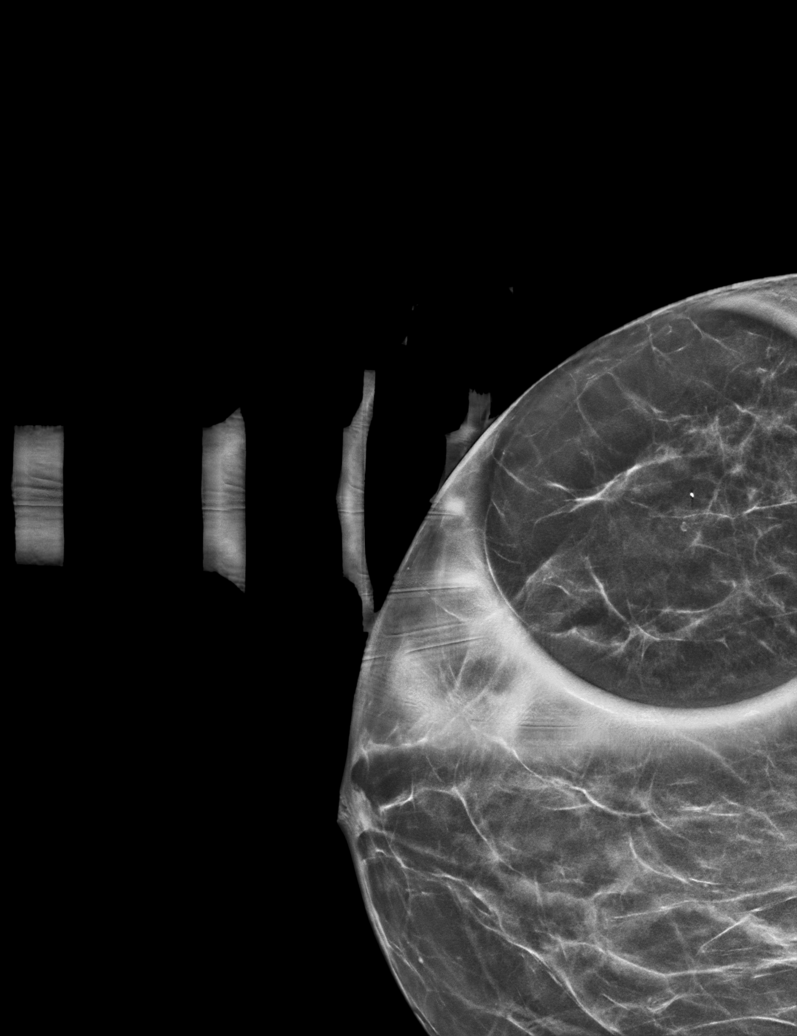

[L MLO synth-2D (2 of 2)]
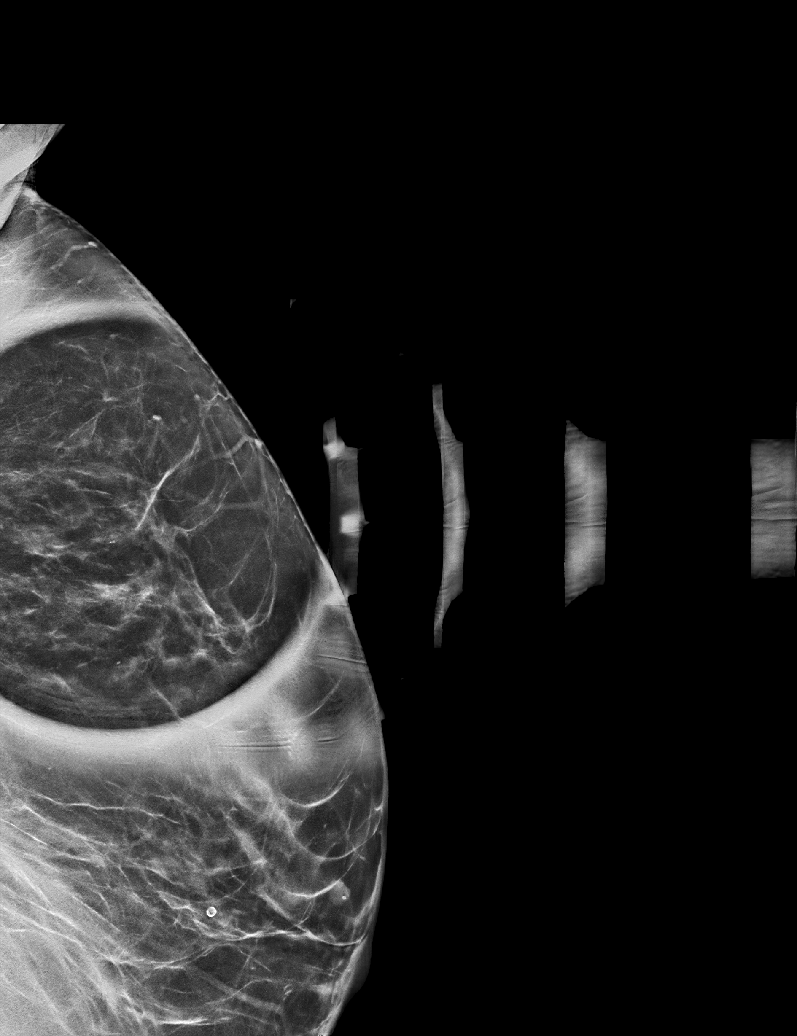

[L MLO tomo · tomo slice 32/63.0]
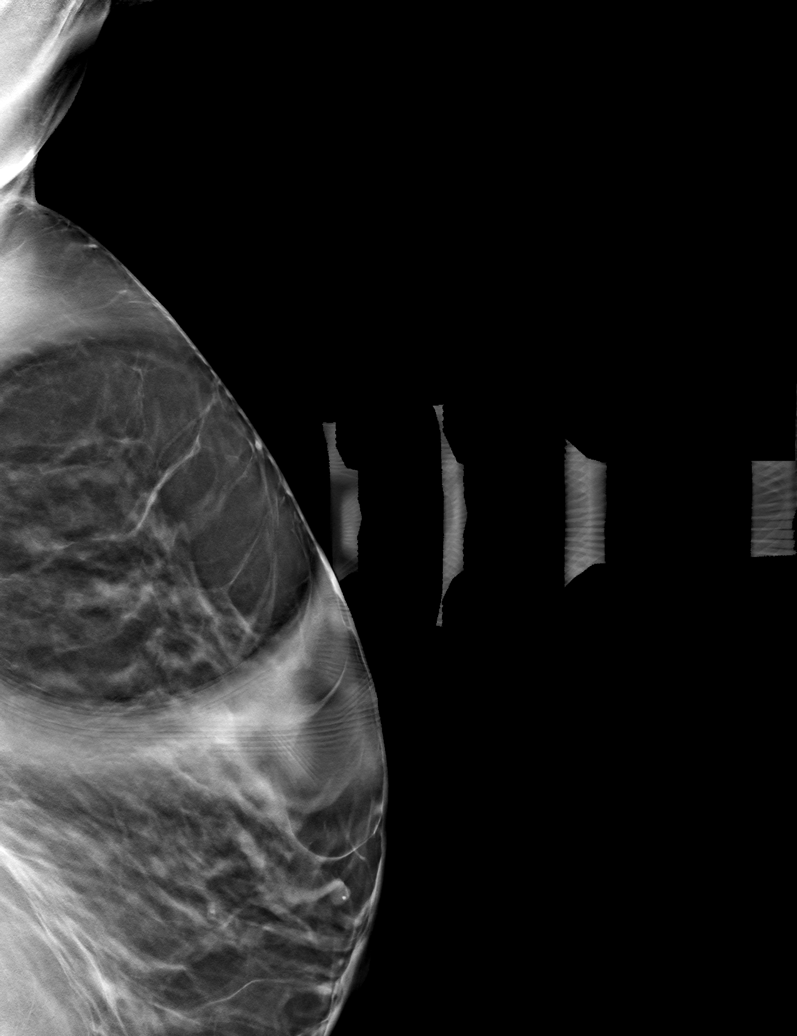

[6 of 30 positions shown; findings below may reference images not displayed]

ACR Breast Density Category c: The breast tissue is heterogeneously
dense, which may obscure small masses.
FINDINGS: Spot compression tomosynthesis images through the upper outer right
breast demonstrates resolution of the asymmetry of concern.

Paddle tomosynthesis images in the lateral projection demonstrates a
persistent superficial mass/cluster of masses in the middle to
posterior depth of the left breast.

Ultrasound targeted to the left breast at 1 o'clock, 8 cm from the
nipple demonstrates a cluster of anechoic oval masses together
spanning 7 x 3 x 5 mm.
IMPRESSION: 1. Resolution of the right breast asymmetry consistent overlapping
fibroglandular tissue.

2. The mass in the superior left breast corresponds with a cluster
of benign cysts.

RECOMMENDATION:
Screening mammogram in one year.(Code:YA-H-PH8)

I have discussed the findings and recommendations with the patient.
If applicable, a reminder letter will be sent to the patient
regarding the next appointment.

BI-RADS CATEGORY  2: Benign.

## 2023-06-21 ENCOUNTER — Other Ambulatory Visit (HOSPITAL_COMMUNITY): Payer: Self-pay | Admitting: Internal Medicine

## 2023-06-21 DIAGNOSIS — E88819 Insulin resistance, unspecified: Secondary | ICD-10-CM

## 2023-06-28 ENCOUNTER — Ambulatory Visit (HOSPITAL_COMMUNITY)
Admission: RE | Admit: 2023-06-28 | Discharge: 2023-06-28 | Disposition: A | Discharge status: Home / Self Care | Payer: Managed Care, Other (non HMO) | Source: Ambulatory Visit | Attending: Internal Medicine | Admitting: Internal Medicine

## 2023-06-28 DIAGNOSIS — E88819 Insulin resistance, unspecified: Secondary | ICD-10-CM | POA: Insufficient documentation

## 2023-07-19 ENCOUNTER — Encounter: Payer: Self-pay | Admitting: *Deleted

## 2023-07-19 NOTE — Progress Notes (Unsigned)
[facility] [address] [place]. [place] [address] [phone]       OFFICE FOLLOW UP NOTE  [name] Date of Birth:  1970/10/23 Medical Record Number:  [id number]   Reason for visit: Initial CPAP follow-up  Virtual Visit via Video Note  I connected with Allison Terrell on 07/20/23 at  1:15 PM EST by a video enabled telemedicine application and verified that I am speaking with the correct person using two identifiers.  Location: Patient: at work Provider: in office   I discussed the limitations of evaluation and management by telemedicine and the availability of in person appointments. The patient expressed understanding and agreed to proceed.    SUBJECTIVE:   CHIEF COMPLAINT:  Yearly sleep apnea follow-up  Follow-up visit:  Prior visit: 07/19/2022  Brief HPI:   Allison Terrell is a 52 y.o. female who is being followed for OSA on CPAP.  HST 09/2019 showed mild OSA with total AHI of 13.6/h and O2 nadir of 87%.  AutoPap initiated in 11/2019.  At prior visit, compliance report showed adequate compliance and optimal residual AHI.  Reports continued benefit with CPAP use.  ESS prior 14/24     Interval history:  Returns today for yearly follow-up via MyChart video visit.  Compliance report continues to show adequate compliance and optimal residual AHI.  Continues to tolerate CPAP well.  Continued benefit in regards to sleep quality and daytime energy levels. ESS 8/24 (prior to CPAP 14/24). Routinely follows with DME [facility] and up to date on supplies.       ROS:   14 system review of systems performed and negative with exception of those listed in HPI  PMH:  Past Medical History:  Diagnosis Date   Anemia    Arthritis    Back pain    Chronic kidney disease    Constipation    Depression    Depression    Edema of both lower extremities    Fatty tumor    GERD (gastroesophageal reflux disease)    IBS (irritable bowel syndrome)     Joint pain    Kidney problem    Osteoarthritis    Sleep apnea     PSH:  Past Surgical History:  Procedure Laterality Date   ABLATION     CESAREAN SECTION     ENDOMETRIAL ABLATION      Social History:  Social History   Socioeconomic History   Marital status: Divorced    Spouse name: Not on file   Number of children: Not on file   Years of education: Not on file   Highest education level: Not on file  Occupational History   Occupation: CSR/Compliance  Tobacco Use   Smoking status: Former   Smokeless tobacco: Never  Substance and Sexual Activity   Alcohol use: Yes    Comment: rare   Drug use: No   Sexual activity: Not on file  Other Topics Concern   Not on file  Social History Narrative   Not on file   Social Determinants of Health   Financial Resource Strain: Not on file  Food Insecurity: Not on file  Transportation Needs: Not on file  Physical Activity: Not on file  Stress: Not on file  Social Connections: Not on file  Intimate Partner Violence: Not on file    Family History:  Family History  Problem Relation Age of Onset   Lung cancer Mother    Heart disease Mother    Diabetes  Mother    Depression Mother    Obesity Mother    Lung cancer Father    Heart disease Father    Alcoholism Father    Drug abuse Father    Breast cancer Sister 66   Asthma Sister     Medications:   Current Outpatient Medications on File Prior to Visit  Medication Sig Dispense Refill   acetaminophen (TYLENOL) 325 MG tablet Take 650 mg by mouth every 6 (six) hours as needed.     buPROPion (WELLBUTRIN XL) 150 MG 24 hr tablet 150 mg. 300 mg qd     Probiotic Product (PROBIOTIC-10 ULTIMATE PO) Take by mouth. qd     tirzepatide (MOUNJARO) 5 MG/0.5ML Pen Inject 5 mg into the skin once a week. 2 mL 0   No current facility-administered medications on file prior to visit.    Allergies:  No Known Allergies    OBJECTIVE: N/A d/t visit type      ASSESSMENT/PLAN: Allison Terrell is a 52 y.o. year old female    OSA on CPAP : Compliance report shows satisfactory usage with optimal residual AHI.  Continue current pressure settings.  Discussed continued nightly usage with ensuring greater than 4 hours nightly for optimal benefit and per insurance purposes.  Continue to follow with DME company for any needed supplies or CPAP related concerns     Follow up in 1 year or call earlier if needed   CC:  PCP: Merri Brunette, MD    I spent 15 minutes of face-to-face and non-face-to-face time with patient via MyChart video visit.  This included previsit chart review, lab review, study review, order entry, electronic health record documentation, patient education and discussion regarding above diagnoses and treatment plan and answered all other questions to patient's satisfaction  Ihor Austin, [facility]  [facility] [address] [place], [place] [address]  Phone [phone] Fax [phone] Note: This document was prepared with digital dictation and possible smart phrase technology. Any transcriptional errors that result from this process are unintentional.

## 2023-07-20 ENCOUNTER — Encounter: Payer: Self-pay | Admitting: Adult Health

## 2023-07-20 ENCOUNTER — Telehealth: Payer: Managed Care, Other (non HMO) | Admitting: Adult Health

## 2023-07-20 DIAGNOSIS — G4733 Obstructive sleep apnea (adult) (pediatric): Secondary | ICD-10-CM

## 2023-07-21 ENCOUNTER — Telehealth: Payer: Self-pay

## 2024-10-08 ENCOUNTER — Ambulatory Visit: Admitting: Adult Health
